# Patient Record
Sex: Female | Born: 1960 | Race: White | Hispanic: No | Marital: Married | Smoking: Never smoker
Health system: Southern US, Community
[De-identification: ages and names within clinical notes are randomized; demographics above are authoritative.]

## PROBLEM LIST (undated history)

## (undated) ENCOUNTER — Emergency Department (HOSPITAL_COMMUNITY): Payer: Self-pay | Source: Home / Self Care

## (undated) DIAGNOSIS — F32A Depression, unspecified: Secondary | ICD-10-CM

## (undated) DIAGNOSIS — I10 Essential (primary) hypertension: Secondary | ICD-10-CM

---

## 1999-04-17 ENCOUNTER — Encounter (INDEPENDENT_AMBULATORY_CARE_PROVIDER_SITE_OTHER): Payer: Self-pay

## 1999-04-17 ENCOUNTER — Ambulatory Visit (HOSPITAL_COMMUNITY): Admission: RE | Admit: 1999-04-17 | Discharge: 1999-04-17 | Payer: Self-pay | Admitting: Obstetrics and Gynecology

## 1999-06-17 ENCOUNTER — Other Ambulatory Visit: Admission: RE | Admit: 1999-06-17 | Discharge: 1999-06-17 | Payer: Self-pay | Admitting: Obstetrics and Gynecology

## 1999-06-18 ENCOUNTER — Other Ambulatory Visit: Admission: RE | Admit: 1999-06-18 | Discharge: 1999-06-18 | Payer: Self-pay | Admitting: Obstetrics and Gynecology

## 1999-06-18 ENCOUNTER — Encounter (INDEPENDENT_AMBULATORY_CARE_PROVIDER_SITE_OTHER): Payer: Self-pay | Admitting: Specialist

## 2000-02-03 ENCOUNTER — Other Ambulatory Visit: Admission: RE | Admit: 2000-02-03 | Discharge: 2000-02-03 | Payer: Self-pay | Admitting: Obstetrics and Gynecology

## 2000-02-04 ENCOUNTER — Other Ambulatory Visit: Admission: RE | Admit: 2000-02-04 | Discharge: 2000-02-04 | Payer: Self-pay | Admitting: Obstetrics and Gynecology

## 2000-02-04 ENCOUNTER — Encounter (INDEPENDENT_AMBULATORY_CARE_PROVIDER_SITE_OTHER): Payer: Self-pay | Admitting: Specialist

## 2000-08-03 ENCOUNTER — Other Ambulatory Visit: Admission: RE | Admit: 2000-08-03 | Discharge: 2000-08-03 | Payer: Self-pay | Admitting: Obstetrics and Gynecology

## 2001-07-14 ENCOUNTER — Encounter (INDEPENDENT_AMBULATORY_CARE_PROVIDER_SITE_OTHER): Payer: Self-pay

## 2001-07-15 ENCOUNTER — Inpatient Hospital Stay (HOSPITAL_COMMUNITY): Admission: RE | Admit: 2001-07-15 | Discharge: 2001-07-16 | Payer: Self-pay | Admitting: Obstetrics and Gynecology

## 2002-02-25 ENCOUNTER — Other Ambulatory Visit: Admission: RE | Admit: 2002-02-25 | Discharge: 2002-02-25 | Payer: Self-pay | Admitting: Obstetrics and Gynecology

## 2003-06-05 ENCOUNTER — Other Ambulatory Visit: Admission: RE | Admit: 2003-06-05 | Discharge: 2003-06-05 | Payer: Self-pay | Admitting: Obstetrics and Gynecology

## 2004-07-16 ENCOUNTER — Other Ambulatory Visit: Admission: RE | Admit: 2004-07-16 | Discharge: 2004-07-16 | Payer: Self-pay | Admitting: Obstetrics and Gynecology

## 2005-07-24 ENCOUNTER — Other Ambulatory Visit: Admission: RE | Admit: 2005-07-24 | Discharge: 2005-07-24 | Payer: Self-pay | Admitting: Obstetrics and Gynecology

## 2007-02-26 ENCOUNTER — Ambulatory Visit (HOSPITAL_COMMUNITY): Admission: RE | Admit: 2007-02-26 | Discharge: 2007-02-26 | Payer: Self-pay | Admitting: Obstetrics and Gynecology

## 2007-02-26 ENCOUNTER — Encounter (INDEPENDENT_AMBULATORY_CARE_PROVIDER_SITE_OTHER): Payer: Self-pay | Admitting: Obstetrics and Gynecology

## 2010-10-01 NOTE — H&P (Signed)
[name], COCO NO.:  [id number]   MEDICAL RECORD NO.:  [id number]          PATIENT TYPE:  AMB   LOCATION:  SDC                           FACILITY:  [facility]   PHYSICIAN:  Juluis Mire, M.D.   DATE OF BIRTH:  January 07, 1961   DATE OF ADMISSION:  02/26/2007  DATE OF DISCHARGE:                              HISTORY & PHYSICAL   The patient is a 50 year old gravida 3, para 3 married female, presents  for laparoscopic evaluation with removal of a left ovarian cyst versus  left ovarian cystectomy.   In relation to the present admission, the patient has had a previous  laparoscopic-assisted vaginal hysterectomy, for management of uterine  fibroids.  She has had some persistent left lower quadrant pain.  Ultrasound evaluation continue to reveal a 5 to 6-cm left ovarian cyst.  This persisted despite evaluation.  Her CA-125 was normal.  In view of  the persistence and the pain, we are going to proceed with laparoscopic  evaluation, possible left salpingo-oophorectomy.   ALLERGIES:  IN TERMS OF ALLERGIES, THE PATIENT HAS NO KNOWN DRUG  ALLERGIES.   MEDICATIONS:  She takes lisinopril for hypertension, Premarin and  Nexium.   PAST MEDICAL HISTORY:  Usual childhood disease without any significant  sequelae.   PAST SURGICAL HISTORY:  She had previous diagnostic laparoscopy and  hysteroscopy in 2000.  Subsequently in 2003, she had a laparoscopic-  assisted vaginal hysterectomy.  She has had a laparoscopic bilateral  tubal ligation.   OBSTETRICAL HISTORY:  Has three vaginal deliveries.   FAMILY HISTORY:  Mother with a history of hypertension, as well as lung  cancer.   SOCIAL HISTORY:  No tobacco, alcohol use.   REVIEW OF SYSTEMS:  Noncontributory.   PHYSICAL EXAM:  VITAL SIGNS:  The patient is afebrile, stable vital  signs.  HEENT: The patient is normocephalic.  Pupils equal, round, reactive to  light and accommodation.  Extraocular movements were intact.   Sclerae  and conjunctivae are clear.  Oropharynx clear.  NECK:  Without thyromegaly.  BREASTS:  No discrete masses.  LUNGS:  Clear.  CARDIOVASCULAR:  System regular in rate, no murmurs or gallops.  No  carotid bruits.  ABDOMINAL:  Exam is benign.  No mass, organomegaly or tenderness.  PELVIC:  Normal external genitalia.  Vaginal mucosa is clear.  Cuff  intact.  She has good support.  BIMANUAL:  Unable to feel the mass on the left side.  Therefore, bi-  manual exam is unremarkable.  EXTREMITIES:  Trace edema.  NEUROLOGIC:  Grossly within normal limits.   IMPRESSION:  Persistent cystic enlargement, left ovary with associated  pain.   PLAN:  The patient to undergo laparoscopic evaluation, possible removal  of cyst versus ovary.  The risks of surgery have been discussed,  including the risk of infection.  The risk of hemorrhage that could  require transfusion with the risk of AIDS or hepatitis.  Risk of injury  to adjacent organs including bladder, bowel, ureters that could require  further exploratory surgery.  Risk of deep venous thrombosis and  pulmonary  emboli.  The patient expressed understanding, indications and  risks.      Juluis Mire, M.D.  Electronically Signed     JSM/MEDQ  D:  02/26/2007  T:  02/26/2007  Job:  [id number]

## 2010-10-01 NOTE — Op Note (Signed)
[name], [name]                 ACCOUNT NO.:  [id number]   MEDICAL RECORD NO.:  [id number]          PATIENT TYPE:  AMB   LOCATION:  SDC                           FACILITY:  [facility]   PHYSICIAN:  Juluis Mire, M.D.   DATE OF BIRTH:  12/28/1960   DATE OF PROCEDURE:  02/26/2007  DATE OF DISCHARGE:                               OPERATIVE REPORT   PREOPERATIVE DIAGNOSIS:  Persistent cystic enlargement of the left ovary  with associated pelvic pain.   POSTOPERATIVE DIAGNOSIS:  Persistent cystic enlargement of the left  ovary with associated pelvic pain.   OPERATIVE PROCEDURE:  Open laparoscopy.  Lysis of adhesions.  Pelvic  washings.  Peritoneal biopsies.  Left salpingo-oophorectomy.   SURGEON:  Juluis Mire, M.D.   ANESTHESIA:  Was general.   ESTIMATED BLOOD LOSS:  Minimal.   PACKS AND DRAINS:  None.   INTRAOPERATIVE BLOOD REPLACED:  None.   COMPLICATIONS:  None.   INDICATIONS:  Are dictated in the history and physical.   PROCEDURE AS FOLLOWS:  The patient was taken to OR and placed in supine  position.  After satisfactory level of general anesthesia was obtained,  the patient was placed in the dorsal supine position using the Allen  stirrups.  The abdomen, perineum and vagina were prepped out with  Betadine.  Bladder was emptied by in-and-out catheterization.  A  subumbilical incision was made with a knife and extended to the  subcutaneous tissue.  Fascia was entered sharply and incision in fascia  extended laterally.  Muscles were separated, and the peritoneum was  entered with blunt finger pressure.  The taut open laparoscopic trocar  was put in place and secured.  The laparoscope was introduced.  There  was no evidence of injury to adjacent organs.  A 5-mm trocar was put in  place in the suprapubic area under direct visualization.  On  visualization, she had some implant areas on the bladder flap as well as  the right perineal area.  There was no ascites.  The omentum  was normal.  The upper abdomen including liver was clear as well as the diaphragm.  We elevated the left ovary.  There was some adhesions from the sigmoid  colon to the left pelvic sidewall.  The right ovary was unremarkable.  Because of these areas of implants which could be scarring or burned out  endometriosis, we did pelvic washings.  We also did biopsies from the  areas of the peritoneum and sent for pathological review.  At this point  in time, a 5-mm trocar was put in place in the left lower quadrant.  We  were able to dissect the adhesions from the sigmoid colon to the left  pelvic side wall.  This mainly involved the __________ .  At this point  in time, the left ovary was elevated.  We were able to make an incision  in  the peritoneum over the left psoas muscle.  We able to dissect down  along the pelvic sidewall.  The ovary was freed up.  The vasculature was  isolated.  We felt the ureter was well below this.  The vasculature was  cauterized and incised using the gyrus.  We then put a 10/11 trocar in  suprapubic area.  We put the ovary in an Endobag, brought it up through  the suprapubic incision.  It was sent for pathological review.  It  appeared to be fairly normal.  At this point in time, we revisualized  the pelvic cavity.  We thoroughly irrigated.  We had good hemostasis in  all areas.  We tried to identify the ureter along his left pelvic  sidewall and had difficulty through the laparoscope.  Decision was to do  cystoscopy.  At this point in time, all trocars were removed after the  abdomen was inflated with carbon dioxide.  Subumbilical fascia was  closed with figure-of-eight of 0 Vicryl, skin with interrupted  subcuticulars of 4-0 Vicryl.  The suprapubic incision was closed with  interrupted subcuticulars of 4-0 Vicryl and Dermabond, and the left  suprapubic incision was closed with Dermabond.   At this point since cystoscopy was performed, the patient was given   indigo carmine.  She had free spillage of blue dye from both ureteral  orifices.  So at this point in time, the bladder was emptied.  Cystoscope was removed.  The patient was taken out of dorsal lithotomy  position.  Once alert and extubated, transferred to the recovery room in  good.  Sponge, instrument and needle count was reported as correct by  circulating nurse x2.      Juluis Mire, M.D.  Electronically Signed     JSM/MEDQ  D:  02/26/2007  T:  02/26/2007  Job:  [id number]

## 2010-10-04 NOTE — Op Note (Signed)
[facility]  Patient:    [name], [name] Visit Number: [id number] MRN: [id number]          Service Type: DSU Location: 9300 [id number] Attending Physician:  Frederich Balding Dictated by:   Juluis Mire, M.D. Proc. Date: 07/14/01 Admit Date:  07/14/2001                             Operative Report  PREOPERATIVE DIAGNOSES:       1. Uterine fibroids.                               2. Uterine adenomyosis.  POSTOPERATIVE DIAGNOSES:      1. Uterine fibroids.                               2. Uterine adenomyosis.  PROCEDURE:                    Laparoscopic assisted vaginal hysterectomy.  SURGEON:                      Juluis Mire, M.D.  ASSISTANT:                    Trevor Iha, M.D.  ANESTHESIA:                   General endotracheal.  ESTIMATED BLOOD LOSS:         400 cc.  PACKS AND DRAINS:             None.  INTRAOPERATIVE BLOOD REPLACEMENT:                  None.  COMPLICATIONS:                None.  INDICATIONS:                  Noted in the history and physical.  DESCRIPTION OF PROCEDURE:     The patient was taken to the OR and placed in the supine position. After a satisfactory level of general endotracheal anesthesia was obtained, the patient was placed in the dorsal lithotomy position using Allen stirrups. The abdomen, perineum, and vagina were prepped out with Betadine. The bladder was emptied by in-and-out catheterization. A Hulka tenaculum was put in place and secured. The patient was draped out for surgery. A subumbilical incision was made with the knife and extended through subcutaneous tissue. The fascia was then identified and entered sharply and the incision in the fascia extended laterally. Rectus muscles were separated in the midline. The peritoneum was entered sharply. Two lateral sutures of 0 Vicryl were put in place in the fascia and held. The Hasson cannula was put in place and secured with the held sutures.  Abdomen was inflated with carbon dioxide. The laparoscope was introduced. There was no evidence of injury to adjacent organs. A 5 mm trocar was put in the suprapubic area under direct visualization. Appendix was normal. Upper abdomen, including liver tip, the gallbladder and both lateral gutters, were clear. Visualization of the pelvis revealed overall uterine enlargement; tubes and ovaries were unremarkable. Previous bilateral tubal ligation was noted. Using the plasma kinetic cautery incising unit, we went about freeing up the adnexa. The right utero-ovarian ligament was  cauterized and incised. The right tube and mesosalpinx was cauterized and incised and the right round ligament was cauterized and incised. With this we had complete freeing up of the right adnexa. On the left side, we first cauterized and incised the utero-ovarian pedicle, then the rube and mesosalpinx were cauterized and incised, and the left round ligament was cauterized and incised. Both sides were adequately freed up and well away from ureters that were easily visualized in the pelvis. At this point in time, we decided to proceed vaginally. The abdomen was deflated of its carbon dioxide. The patients legs were repositioned.  At this point in time, the Hulka tenaculum was then removed. A weighted speculum was placed in the vaginal vault. The cervix was grasped with the [facility] tenaculum. Cul-de-sac was entered sharply. Both uterosacral ligaments were clamped, cut, and suture ligated with 0 Vicryl. The reflection of the vaginal mucosa was incised using the scissors. The bladder was dissected superiorly. Paracervical tissue was clamped, cut, and suture ligated with 0 Vicryl. The uterine vessels were clamped, cut, and suture ligated with 0 Vicryl. The vesicouterine space was entered sharply and retractors put in place to retract the bladder superiorly. Using clamp, cut, and tie technique with suture ligatures of 0 Vicryl,  the parametrium was serially separated from the sides of the uterus. The uterus was then flipped, the remaining pedicles were clamped and cut, and the uterus was passed off the operative field. Held pedicles were secured with two free ties of 0 Vicryl. Uterosacral plication stitch was put in place with 0 Vicryl. Vaginal mucosa was closed in a vertical fashion with interrupted figure-of-eights of 0 Vicryl. At this point in time, we had good hemostasis, a Foley was placed to straight drain with retrieval of an adequate amount of clear urine. Sponge on spongestick was placed on the vaginal vault.  The patients legs were repositioned. The abdomen was reinflated with carbon dioxide and the laparoscope was reintroduced. We thoroughly irrigated the pelvic cavity and no bleeding was noted. At this point in time, the abdomen was deflated of its carbon dioxide, all trocars were removed, subumbilical fascia was closed with a figure-of-eight of 0 Vicryl. Skin was closed with running subcuticular 4-0 Vicryl. Suprapubic incision was closed with Steri-Strips. Sponge on spongestick was removed from the vaginal vault. Urine output remained clear and adequate. Sponge, instrument, and needle count were reported as correct by the circulating nurse x 2. The patient, once extubated, was transferred to recovery room in good condition. Dictated by:   Juluis Mire, M.D. Attending Physician:  Frederich Balding DD:  07/14/01 TD:  07/14/01 Job: [id number]

## 2010-10-04 NOTE — Discharge Summary (Signed)
[facility]  Patient:    MERIS, REEDE Visit Number: [id number] MRN: [id number]          Service Type: GYN Location: 9300 [id number] Attending Physician:  Frederich Balding Dictated by:   Juluis Mire, M.D. Admit Date:  07/14/2001 Discharge Date: 07/16/2001                             Discharge Summary  ADDENDUM  The patient had some increasing nausea and vomiting the evening of discharge. Dr. Marcelle Overlie therefore maintained her overnight.  The following morning, she was again afebrile with stable vital signs.  Her abdomen was soft.  She was passing flatus at this time, and tolerating her diet without difficulty.  She is therefore discharged home with the previous instructions, orders, and conditions. Dictated by:   Juluis Mire, M.D. Attending Physician:  Frederich Balding DD:  07/16/01 TD:  07/16/01 Job: [id number]

## 2010-10-04 NOTE — H&P (Signed)
[facility]  Patient:    [name], [name] Visit Number: [id number] MRN: [id number]          Service Type: DSU Location: 910B [id number] 01 Attending Physician:  Frederich Balding Dictated by:   Juluis Mire, M.D. Admit Date:  07/14/2001                           History and Physical  CHIEF COMPLAINT:              The patient is a 50 year old gravida 3 para 3 married white female, who presents for laparoscopically assisted vaginal hysterectomy for management of uterine fibroids and possible uterine adenomyosis and associated symptomatology.  HISTORY OF PRESENT ILLNESS:   In relation to the present admission, the patient has had a long-standing history of increasing menstrual flow and dysmenorrhea.  Recently she has noted that her cycles will last approximately seven days.  She has six days of flow being heavy, changing pads and tampons every 30 minutes to one hour.  Associated with this heavy flow is clots.  She also has significant pain and discomfort that is unresponsive to over-the-counter management.  She has had anemia, with a hemoglobin of 10.7. This has required iron sulfate supplementation.  She did have a previous laparoscopy and hysteroscopy done in 2000, with finding of uterine fibroids and uterine adenomyosis.  There were no adhesions or other pelvic processes. We have watched her conservatively since the surgery and she has continued to complain of significant menstrual flow, and does have associated anemia.  She has had previous bilateral tubal ligation.  Has had complications using birth control pills in the past, and desires further therapy in the form of laparoscopically assisted vaginal hysterectomy.  PAST GYNECOLOGIC HISTORY:     Her only past significant gynecological history is that she had a previous history of atypical endocervical cells on her Pap smear.  She underwent colposcopy as well as repeat cervical cytology with  no evidence of any significant issues.  It is of note she has had a previous bilateral tubal ligation.  ALLERGIES:                    No known drug allergies.  MEDICATIONS:                  Iron.  PAST MEDICAL HISTORY:         Usual childhood diseases, no significant sequelae.  PAST SURGICAL HISTORY:        1. Previous bilateral tubal ligation.                               2. Surgery as noted above.  PAST OBSTETRICAL HISTORY:     Three spontaneous vaginal deliveries.  FAMILY HISTORY:               Mother with history of hypertension.  Mother also with history of lung cancer.  SOCIAL HISTORY:               No tobacco or alcohol use.  REVIEW OF SYSTEMS:            Noncontributory.  PHYSICAL EXAMINATION:  VITAL SIGNS:                  The patient is afebrile with stable vital signs.  HEENT:  Normocephalic.  PERRLA.  EOMI.  Sclerae and conjunctivae clear.  Oropharynx clear.  NECK:                         Without thyromegaly.  BREAST:                       No discrete masses.  LUNGS:                        Clear.  CARDIOVASCULAR:               Regular rate and rhythm without murmurs or gallops.  ABDOMEN:                      Benign.  No masses, organomegaly, or tenderness.  PELVIC:                       Normal external genitalia.  Vaginal mucosa clear.  Cervix unremarkable.  Uterus enlarged with multiple fibroids, approximately eight weeks.  Adnexa unremarkable.  EXTREMITIES:                  Trace edema.  NEUROLOGIC:                   Grossly within normal limits.  IMPRESSION:                   Menorrhagia and dysmenorrhea secondary to uterine fibroids or uterine adenomyosis.  PLAN:                         The patient will undergo laparoscopically assisted vaginal hysterectomy.  Alternatives to surgery have been discussed including hormonal management and possible endometrial ablation.  The patient desires definitive therapy.  The risks of  surgery have been discussed including the risk of infection, the risk of hemorrhage that could necessitate transfusion with the risk of AIDS or hepatitis, the risk of injury to adjacent organs including bladder, bowel, or ureters that could require further exploratory surgery, risk of deep vein thrombosis and pulmonary embolus.  The patient expressed understanding of indications and risks. Dictated by:   Juluis Mire, M.D. Attending Physician:  Frederich Balding DD:  07/14/01 TD:  07/14/01 Job: [id number]

## 2010-10-04 NOTE — Discharge Summary (Signed)
[facility]  Patient:    [name], [name] Visit Number: [id number] MRN: [id number]          Service Type: DSU Location: 9300 [id number] Attending Physician:  Frederich Balding Dictated by:   Juluis Mire, M.D. Admit Date:  07/14/2001 Disc. Date: 07/15/01                             Discharge Summary  ADMISSION DIAGNOSES:          1. Uterine fibroids.                               2. Possible uterine adenomyosis.  DISCHARGE DIAGNOSES:          1. Uterine fibroids.                               2. Possible uterine adenomyosis with pathology                                  still pending.  OPERATIVE PROCEDURE:          Laparoscopic assisted vaginal hysterectomy.  For complete history and physical please see dictated note.  HOSPITAL COURSE:              The patient underwent laparoscopic assisted vaginal hysterectomy.  Ovaries were left in place.  Postoperatively, the patient did well.  Postoperative hemoglobin was 7.9.  It had preoperatively been 9.4 so we felt that was stable.  She was discharged home at her request on her first postoperative day.  At that time, she was afebrile with stable vital signs.  Her lungs were clear.  Abdomen was soft and nontender. Bowel sounds were active. All incisions were intact.  She had minimal vaginal draining.  Urine output was adequate.  COMPLICATIONS:                None during stay in the hospital.  CONDITION ON DISCHARGE:       Stable.  DISPOSITION:                  The patient will be discharged home on Tylox as needed for pain and iron sulfate supplementation.  We will reassess her hemoglobin back in the office next week.  She is to avoid heavy lifting, vaginal entry, or driving a car.  She will watch for signs of infection, nausea, vomiting, increased abdominal pain or active vaginal bleeding. Dictated by:   Juluis Mire, M.D. Attending Physician:  Frederich Balding DD:  07/15/01 TD:   07/15/01 Job: [id number]

## 2011-02-27 LAB — CBC
HCT: 40.3
Hemoglobin: 14
MCHC: 34.7
MCV: 84
RDW: 12.7

## 2014-04-17 ENCOUNTER — Encounter: Payer: Self-pay | Admitting: Podiatry

## 2014-04-17 ENCOUNTER — Ambulatory Visit (INDEPENDENT_AMBULATORY_CARE_PROVIDER_SITE_OTHER): Payer: BC Managed Care – PPO | Admitting: Podiatry

## 2014-04-17 ENCOUNTER — Ambulatory Visit (INDEPENDENT_AMBULATORY_CARE_PROVIDER_SITE_OTHER): Payer: BC Managed Care – PPO

## 2014-04-17 VITALS — BP 134/76 | HR 64 | Resp 16

## 2014-04-17 DIAGNOSIS — M722 Plantar fascial fibromatosis: Secondary | ICD-10-CM

## 2014-04-17 MED ORDER — TRIAMCINOLONE ACETONIDE 10 MG/ML IJ SUSP
10.0000 mg | Freq: Once | INTRAMUSCULAR | Status: AC
  Administered 2014-04-17: 10 mg

## 2014-04-17 MED ORDER — DICLOFENAC SODIUM 75 MG PO TBEC: 75.0000 mg | DELAYED_RELEASE_TABLET | Freq: Two times a day (BID) | ORAL | Status: DC

## 2014-04-17 NOTE — Progress Notes (Signed)
Subjective:     Patient ID: [name], female   DOB: 01/24/1961, 53 y.o.   MRN: [id number]  HPI patient states that she's been having a lot of pain in her heels over the last few months and it's worse when she stands and walks on them does not remember any specific injury   Review of Systems  All other systems reviewed and are negative.      Objective:   Physical Exam  Constitutional: She is oriented to person, place, and time.  Cardiovascular: Intact distal pulses.   Musculoskeletal: Normal range of motion.  Neurological: She is oriented to person, place, and time.  Skin: Skin is warm.  Nursing note and vitals reviewed.  neurovascular status intact with muscle strength adequate and range of motion subtalar and midtarsal joint within normal limits. Patient has mild equinus is noted to have good digital perfusion and is well oriented 3 and I noted there to be exquisite tenderness in the plantar center of both heels right over left     Assessment:     Plantar fasciitis right over left heel insertional in origin    Plan:     H&P and condition discussed. Injected the plantar fascia bilateral 3 mg Kenalog 5 mg Xylocaine and applied fascially brace to the right. Placed on diclofenac 75 mg twice a day and reappoint in 1 week to recheck

## 2014-04-17 NOTE — Progress Notes (Signed)
   Subjective:    Patient ID: [name], female    DOB: 04/03/1961, 53 y.o.   MRN: [id number]  HPI Comments: "I have pain in the heel"  Patient c/o aching plantar/medial heel right for couple months. No AM pain. Taking Ibuprofen. Tried new shoes. Not helping. Some of the same pain on left but doesn't hurt everyday.  Foot Pain Associated symptoms include arthralgias.      Review of Systems  Musculoskeletal: Positive for back pain, arthralgias and gait problem.  All other systems reviewed and are negative.      Objective:   Physical Exam        Assessment & Plan:

## 2014-04-17 NOTE — Patient Instructions (Signed)

## 2014-04-24 ENCOUNTER — Ambulatory Visit: Payer: BC Managed Care – PPO | Admitting: Podiatry

## 2014-05-09 ENCOUNTER — Other Ambulatory Visit: Payer: Self-pay

## 2014-05-10 LAB — CYTOLOGY - PAP

## 2014-08-03 ENCOUNTER — Encounter: Payer: Self-pay | Admitting: Podiatry

## 2014-08-03 ENCOUNTER — Ambulatory Visit (INDEPENDENT_AMBULATORY_CARE_PROVIDER_SITE_OTHER): Payer: BLUE CROSS/BLUE SHIELD | Admitting: Podiatry

## 2014-08-03 VITALS — BP 112/64 | HR 58 | Resp 14

## 2014-08-03 DIAGNOSIS — M722 Plantar fascial fibromatosis: Secondary | ICD-10-CM | POA: Diagnosis not present

## 2014-08-03 MED ORDER — DICLOFENAC SODIUM 75 MG PO TBEC: 75.0000 mg | DELAYED_RELEASE_TABLET | Freq: Two times a day (BID) | ORAL | Status: DC

## 2014-08-03 MED ORDER — TRIAMCINOLONE ACETONIDE 10 MG/ML IJ SUSP
10.0000 mg | Freq: Once | INTRAMUSCULAR | Status: AC
  Administered 2014-08-03: 10 mg

## 2014-08-03 NOTE — Progress Notes (Signed)
   Subjective:    Patient ID: [name], female    DOB: 01/01/1961, 54 y.o.   MRN: [id number]  HPI F/U had inj in right foot on 04-17-14, had good result up until about a couple of weeks ago. Left foot also started hurting recently.    Review of Systems  All other systems reviewed and are negative.      Objective:   Physical Exam        Assessment & Plan:

## 2014-08-03 NOTE — Progress Notes (Signed)
Subjective:     Patient ID: [name], female   DOB: 01/01/1961, 54 y.o.   MRN: [id number]  HPI patient states that my heels have been hurting me again and it's walking on the cement floors that seems to cause the problem and I often work 50-60 hours a week   Review of Systems     Objective:   Physical Exam Neurovascular status intact with muscle strength adequate and severe discomfort medial fascial band right over left at the insertional point of the tendon into the calcaneus. Patient is found to have moderate depression of the arch    Assessment:     Plantar fasciitis right over left heel with inflammation and fluid buildup    Plan:     I injected the plantar fascia of both feet 3 mg Kenalog 5 mg Xylocaine and dispensed a fascially brace for the left and scanned for custom orthotics to reduce stress. I explained the importance of stretching exercises and supportive shoe gear and I prescribed diclofenac 75 mg twice a day

## 2014-08-25 ENCOUNTER — Ambulatory Visit: Payer: BLUE CROSS/BLUE SHIELD | Admitting: *Deleted

## 2014-08-25 ENCOUNTER — Telehealth: Payer: Self-pay | Admitting: Podiatry

## 2014-08-25 DIAGNOSIS — M722 Plantar fascial fibromatosis: Secondary | ICD-10-CM

## 2014-08-25 NOTE — Patient Instructions (Signed)

## 2014-08-25 NOTE — Telephone Encounter (Signed)
PT CALLED ASKING IF WE HAD CONTACTED HER INSURANCE AND IF SHE WOULD HAVE TO PAY FOR HER ORTHOTICS.

## 2014-08-25 NOTE — Progress Notes (Signed)
Patient ID: [name], female   DOB: 01/20/1961, 54 y.o.   MRN: [id number] PICKING UP INSERTS

## 2014-08-25 NOTE — Telephone Encounter (Signed)
Ms Chip BoerVicki could you please assist me there is no FYI on orthotic coverage and as of today no response from the insurance company for the charges.

## 2014-11-13 ENCOUNTER — Encounter: Payer: Self-pay | Admitting: Podiatry

## 2014-11-13 ENCOUNTER — Ambulatory Visit (INDEPENDENT_AMBULATORY_CARE_PROVIDER_SITE_OTHER): Payer: BLUE CROSS/BLUE SHIELD | Admitting: Podiatry

## 2014-11-13 VITALS — BP 140/86 | HR 61 | Resp 15

## 2014-11-13 DIAGNOSIS — M722 Plantar fascial fibromatosis: Secondary | ICD-10-CM

## 2014-11-13 NOTE — Patient Instructions (Signed)
Pre-Operative Instructions  Congratulations, you have decided to take an important step to improving your quality of life.  You can be assured that the doctors of [facility] will be with you every step of the way.  1. Plan to be at the surgery center/hospital at least 1 (one) hour prior to your scheduled time unless otherwise directed by the surgical center/hospital staff.  You must have a responsible adult accompany you, remain during the surgery and drive you home.  Make sure you have directions to the surgical center/hospital and know how to get there on time. 2. For hospital based surgery you will need to obtain a history and physical form from your family physician within 1 month prior to the date of surgery- we will give you a form for you primary physician.  3. We make every effort to accommodate the date you request for surgery.  There are however, times where surgery dates or times have to be moved.  We will contact you as soon as possible if a change in schedule is required.   4. No Aspirin/Ibuprofen for one week before surgery.  If you are on aspirin, any non-steroidal anti-inflammatory medications (Mobic, Aleve, Ibuprofen) you should stop taking it 7 days prior to your surgery.  You make take Tylenol  For pain prior to surgery.  5. Medications- If you are taking daily heart and blood pressure medications, seizure, reflux, allergy, asthma, anxiety, pain or diabetes medications, make sure the surgery center/hospital is aware before the day of surgery so they may notify you which medications to take or avoid the day of surgery. 6. No food or drink after midnight the night before surgery unless directed otherwise by surgical center/hospital staff. 7. No alcoholic beverages 24 hours prior to surgery.  No smoking 24 hours prior to or 24 hours after surgery. 8. Wear loose pants or shorts- loose enough to fit over bandages, boots, and casts. 9. No slip on shoes, sneakers are best. 10. Bring  your boot with you to the surgery center/hospital.  Also bring crutches or a walker if your physician has prescribed it for you.  If you do not have this equipment, it will be provided for you after surgery. 11. If you have not been contracted by the surgery center/hospital by the day before your surgery, call to confirm the date and time of your surgery. 12. Leave-time from work may vary depending on the type of surgery you have.  Appropriate arrangements should be made prior to surgery with your employer. 13. Prescriptions will be provided immediately following surgery by your doctor.  Have these filled as soon as possible after surgery and take the medication as directed. 14. Remove nail polish on the operative foot. 15. Wash the night before surgery.  The night before surgery wash the foot and leg well with the antibacterial soap provided and water paying special attention to beneath the toenails and in between the toes.  Rinse thoroughly with water and dry well with a towel.  Perform this wash unless told not to do so by your physician.  Enclosed: 1 Ice pack (please put in freezer the night before surgery)   1 Hibiclens skin cleaner   Pre-op Instructions  If you have any questions regarding the instructions, do not hesitate to call our office.  [place]: [address] [place], [place] [address] [phone]  [place]: [address] [place], [place] [address] [phone]  [place]: [address]  [place], [place] [address] [phone]  Dr. Richard   Tuchman DPM, Dr. Norman Regal DPM Dr. Richard Sikora DPM, Dr. M. Todd Hyatt DPM, Dr. Kathryn Egerton DPM 

## 2014-11-15 NOTE — Progress Notes (Signed)
Subjective:     Patient ID: [name], female   DOB: 09/25/1960, 54 y.o.   MRN: [id number]  HPI patient states I developed shooting pain in the plantar aspect of my right heel that's more recent duration. States it's been very hard to walk comfortably with   Review of Systems     Objective:   Physical Exam Neurovascular status intact muscle strength adequate with discomfort plantar aspect right heel at the insertional point tendon the calcaneus with inflammation fluid noted and swelling around the insertion into the calcaneus    Assessment:     Plantar fasciitis right with inflammation and fluid buildup noted    Plan:     H&P x-rays reviewed and today injected the plantar fascia 3 Milligan Kenalog 5 mill grams Xylocaine applied fascial brace and instructed on physical therapy and supportive shoe gear usage. Placed on diclofenac 75 mg twice a day and reappoint to recheck in one week

## 2014-11-17 ENCOUNTER — Telehealth: Payer: Self-pay | Admitting: *Deleted

## 2014-11-17 NOTE — Telephone Encounter (Signed)
"  I have surgery scheduled on 08/02 with Dr. Charlsie Merlesegal.  I need to know my portion for the bill.  I know I have a deductible so please let me know that, I'd greatly appreciate it."

## 2014-11-21 ENCOUNTER — Telehealth: Payer: Self-pay | Admitting: *Deleted

## 2014-11-21 NOTE — Telephone Encounter (Addendum)
Pt states she is scheduled or surgery 12/19/2014, but is in severe pain and would like a shot.  Dr. Charlsie Merlesegal would like pt to come in to discuss pain management option.  Left a message for pt to schedule an appt.

## 2014-11-21 NOTE — Telephone Encounter (Signed)
Bring her in

## 2014-11-22 ENCOUNTER — Ambulatory Visit (INDEPENDENT_AMBULATORY_CARE_PROVIDER_SITE_OTHER): Payer: BLUE CROSS/BLUE SHIELD | Admitting: Podiatry

## 2014-11-22 ENCOUNTER — Encounter: Payer: Self-pay | Admitting: Podiatry

## 2014-11-22 DIAGNOSIS — M722 Plantar fascial fibromatosis: Secondary | ICD-10-CM | POA: Diagnosis not present

## 2014-11-22 MED ORDER — TRIAMCINOLONE ACETONIDE 10 MG/ML IJ SUSP
10.0000 mg | Freq: Once | INTRAMUSCULAR | Status: AC
  Administered 2014-11-22: 10 mg

## 2014-11-23 NOTE — Telephone Encounter (Signed)
I called and informed patient that she has a $1,500 deductible.  Once the deductible is met your insurance will cover 80% and you'll be responsible for 20%.  "I have to pay that before I can have surgery?"  No, we will file it with your insurance.  Then will send you a statement and you can make payment arrangements.  "Okay, good I was worried."  If you need the cost for facility and anesthesia, call the surgical center.

## 2014-11-24 NOTE — Progress Notes (Signed)
Subjective:     Patient ID: [name], female   DOB: 07/02/1960, 54 y.o.   MRN: [id number]  HPI patient states I'm do for surgery and around a month and the pain is so bad and I'm going on vacation and I would like to get medicine to try to help me through this.   Review of Systems     Objective:   Physical Exam Neurovascular status is intact with severe discomfort in the medial fascial band of both heels with inflammation and fluid at the insertion of the tendon into the calcaneus    Assessment:     Plantar fasciitis bilateral with inflammation and fluid buildup    Plan:     Reviewed condition and injected the plantar fascia 3 mg Kenalog 5 mg Xylocaine and advised on physical therapy and I inflammatory and supportive shoe gear usage until surgery can be obtained

## 2014-12-19 ENCOUNTER — Encounter: Payer: Self-pay | Admitting: Podiatry

## 2014-12-19 DIAGNOSIS — M722 Plantar fascial fibromatosis: Secondary | ICD-10-CM | POA: Diagnosis not present

## 2014-12-26 NOTE — Progress Notes (Signed)
DOS 12/19/2014 Endoscopic release medial band right heel.

## 2014-12-28 ENCOUNTER — Encounter: Payer: Self-pay | Admitting: Podiatry

## 2014-12-28 ENCOUNTER — Ambulatory Visit (INDEPENDENT_AMBULATORY_CARE_PROVIDER_SITE_OTHER): Payer: BLUE CROSS/BLUE SHIELD | Admitting: Podiatry

## 2014-12-28 VITALS — BP 139/93 | HR 60 | Resp 16

## 2014-12-28 DIAGNOSIS — M722 Plantar fascial fibromatosis: Secondary | ICD-10-CM

## 2014-12-28 NOTE — Progress Notes (Signed)
Subjective:     Patient ID: [name], female   DOB: Nov 30, 1960, 54 y.o.   MRN: [id number]  HPI patient states I'm doing well with my heel   Review of Systems     Objective:   Physical Exam Neurovascular status intact with incision sites and wound edges well coapted on the medial and lateral side of the right heel with negative Homans sign noted    Assessment:     Doing well post endoscopic procedure right heel    Plan:     Sterile dressing reapplied advised on elevation and continued boot usage and dispensed surgical shoe. Patient will be seen back 2 weeks for suture removal or earlier if necessary

## 2015-01-09 ENCOUNTER — Ambulatory Visit: Payer: BLUE CROSS/BLUE SHIELD | Admitting: Podiatry

## 2015-01-09 DIAGNOSIS — M722 Plantar fascial fibromatosis: Secondary | ICD-10-CM

## 2015-04-19 ENCOUNTER — Encounter: Payer: Self-pay | Admitting: Podiatry

## 2015-04-19 ENCOUNTER — Ambulatory Visit (INDEPENDENT_AMBULATORY_CARE_PROVIDER_SITE_OTHER): Payer: BLUE CROSS/BLUE SHIELD

## 2015-04-19 ENCOUNTER — Ambulatory Visit (INDEPENDENT_AMBULATORY_CARE_PROVIDER_SITE_OTHER): Payer: BLUE CROSS/BLUE SHIELD | Admitting: Podiatry

## 2015-04-19 VITALS — BP 137/83 | HR 60 | Resp 16

## 2015-04-19 DIAGNOSIS — M79672 Pain in left foot: Secondary | ICD-10-CM | POA: Diagnosis not present

## 2015-04-19 DIAGNOSIS — M779 Enthesopathy, unspecified: Secondary | ICD-10-CM | POA: Diagnosis not present

## 2015-04-19 DIAGNOSIS — L600 Ingrowing nail: Secondary | ICD-10-CM | POA: Diagnosis not present

## 2015-04-19 MED ORDER — TRIAMCINOLONE ACETONIDE 10 MG/ML IJ SUSP
10.0000 mg | Freq: Once | INTRAMUSCULAR | Status: AC
  Administered 2015-04-19: 10 mg

## 2015-04-19 NOTE — Patient Instructions (Signed)

## 2015-04-22 NOTE — Progress Notes (Signed)
Subjective:     Patient ID: [name], female   DOB: 12/05/1960, 54 y.o.   MRN: [id number]  HPI patient states I have pain in both my feet but the left is worse and it hurts on the outside of the foot and I have a painful ingrown toenail on my left big toe that I tried to trim and soak without relief   Review of Systems     Objective:   Physical Exam Neurovascular status intact muscle strength was adequate with incurvated lateral border left hallux that's painful and pain in the peroneal group and peroneal tertius group left with no indication of muscle strength loss but quite a bit of discomfort left over right    Assessment:     Inflammatory tendinitis and ingrown toenail condition    Plan:     H&P and conditions reviewed with patient. Did a careful sheath injection of the peroneal tendon group at the insertion base of fifth metatarsal 3 mg Kenalog 5 mg Xylocaine advised on ice and heat therapy and discussed correction of the ingrown toenail. Patient wants this fixed and I explained surgery and risk and today I infiltrated the left hallux 60 mg Xylocaine Marcaine mixture remove the lateral border exposed matrix and applied phenol 3 applications 30 seconds followed by alcohol lavage and sterile dressing. Gave instructions on soaks and reappoint

## 2015-04-25 ENCOUNTER — Telehealth: Payer: Self-pay | Admitting: *Deleted

## 2015-04-25 NOTE — Telephone Encounter (Signed)
Left message for patient at [phone] (Cell #) to check to see how they were doing from their ingrown toenail procedure that was performed on Thursday, April 19, 2015. Waiting for a response.

## 2015-05-03 ENCOUNTER — Ambulatory Visit: Payer: BLUE CROSS/BLUE SHIELD | Admitting: Podiatry

## 2016-03-10 DIAGNOSIS — E782 Mixed hyperlipidemia: Secondary | ICD-10-CM | POA: Insufficient documentation

## 2016-03-10 DIAGNOSIS — J302 Other seasonal allergic rhinitis: Secondary | ICD-10-CM | POA: Insufficient documentation

## 2016-03-10 DIAGNOSIS — Z6831 Body mass index (BMI) 31.0-31.9, adult: Secondary | ICD-10-CM

## 2016-03-10 DIAGNOSIS — E6609 Other obesity due to excess calories: Secondary | ICD-10-CM | POA: Insufficient documentation

## 2016-03-10 DIAGNOSIS — F419 Anxiety disorder, unspecified: Secondary | ICD-10-CM | POA: Insufficient documentation

## 2016-03-10 DIAGNOSIS — I10 Essential (primary) hypertension: Secondary | ICD-10-CM | POA: Insufficient documentation

## 2016-03-10 DIAGNOSIS — G4709 Other insomnia: Secondary | ICD-10-CM | POA: Insufficient documentation

## 2016-03-10 DIAGNOSIS — K219 Gastro-esophageal reflux disease without esophagitis: Secondary | ICD-10-CM | POA: Insufficient documentation

## 2016-07-28 ENCOUNTER — Ambulatory Visit: Payer: BLUE CROSS/BLUE SHIELD | Admitting: Podiatry

## 2016-07-31 ENCOUNTER — Ambulatory Visit (INDEPENDENT_AMBULATORY_CARE_PROVIDER_SITE_OTHER): Payer: BLUE CROSS/BLUE SHIELD | Admitting: Podiatry

## 2016-07-31 ENCOUNTER — Ambulatory Visit (INDEPENDENT_AMBULATORY_CARE_PROVIDER_SITE_OTHER): Payer: BLUE CROSS/BLUE SHIELD

## 2016-07-31 ENCOUNTER — Encounter: Payer: Self-pay | Admitting: Podiatry

## 2016-07-31 DIAGNOSIS — M7750 Other enthesopathy of unspecified foot: Secondary | ICD-10-CM

## 2016-07-31 DIAGNOSIS — M779 Enthesopathy, unspecified: Secondary | ICD-10-CM

## 2016-07-31 DIAGNOSIS — M79672 Pain in left foot: Secondary | ICD-10-CM

## 2016-07-31 MED ORDER — TRIAMCINOLONE ACETONIDE 10 MG/ML IJ SUSP
10.0000 mg | Freq: Once | INTRAMUSCULAR | Status: AC
  Administered 2016-07-31: 10 mg

## 2016-08-01 NOTE — Progress Notes (Signed)
Subjective:     Patient ID: [name], female   DOB: 06/18/1960, 56 y.o.   MRN: [id number]  HPI patient presents stating that the top outside of her left foot has been very sore and she does not remember specific injury   Review of Systems     Objective:   Physical Exam Neurovascular status intact negative Homans sign was noted with patient's left lateral foot being very inflamed and painful    Assessment:     Tendinitis left lateral foot with inflammation fluid and no indications of stress fracture clinically    Plan:     X-rays and condition reviewed and today I did careful steroid injection 3 mg Kenalog 5 mg Xylocaine advised on heat ice therapy and shoe gear modifications. Reappoint for us to recheck  X-ray indicates that there is no signs of stress fracture or advanced arthritis with this condition

## 2016-08-28 ENCOUNTER — Ambulatory Visit: Payer: BLUE CROSS/BLUE SHIELD | Admitting: Podiatry

## 2018-05-24 ENCOUNTER — Ambulatory Visit: Payer: BLUE CROSS/BLUE SHIELD | Admitting: Podiatry

## 2018-05-24 ENCOUNTER — Encounter: Payer: Self-pay | Admitting: Podiatry

## 2018-05-24 DIAGNOSIS — B351 Tinea unguium: Secondary | ICD-10-CM

## 2018-05-31 NOTE — Progress Notes (Signed)
Subjective:   Patient ID: [name], female   DOB: 58 y.o.   MRN: [id number]   HPI Patient presents with concerns about discoloration of the hallux nail bed bilateral stating that while is not hurting it is disconcerting to her and she does not remember specific injury   ROS      Objective:  Physical Exam  Neurovascular status intact with discoloration of the hallux nails bilateral that is localized with no indications currently that there is any proximal infection or other pathology.  There is moderate discoloration of the nailbeds and there is some dark discoloration associated with it     Assessment:  Probability for trauma of the nailbed with possible underlying mycotic infection     Plan:  H&P condition reviewed and today I recommended that we treated conservatively with wider shoes soaks therapy and I do not recommend this be treated as a fungal infection.  If it were to get worse or they were to become painful nail removal is possible in the future

## 2019-11-28 ENCOUNTER — Ambulatory Visit: Payer: Self-pay

## 2019-11-28 ENCOUNTER — Other Ambulatory Visit: Payer: Self-pay

## 2019-11-28 ENCOUNTER — Ambulatory Visit: Payer: BC Managed Care – PPO | Admitting: Family Medicine

## 2019-11-28 ENCOUNTER — Encounter: Payer: Self-pay | Admitting: Family Medicine

## 2019-11-28 DIAGNOSIS — G8929 Other chronic pain: Secondary | ICD-10-CM

## 2019-11-28 DIAGNOSIS — M25552 Pain in left hip: Secondary | ICD-10-CM

## 2019-11-28 DIAGNOSIS — M5442 Lumbago with sciatica, left side: Secondary | ICD-10-CM | POA: Diagnosis not present

## 2019-11-28 MED ORDER — TRAMADOL HCL 50 MG PO TABS: 50.0000 mg | ORAL_TABLET | Freq: Four times a day (QID) | ORAL | 0 refills | Status: DC | PRN

## 2019-11-28 NOTE — Progress Notes (Signed)
   Office Visit Note   Patient: [name]           Date of Birth: 1960/10/02           MRN: [id number] Visit Date: 11/28/2019 Requested by: Ladora Daniel, PA-C [address] [place],  [place] [address] PCP: Ladora Daniel, PA-C  Subjective: Chief Complaint  Patient presents with  . Lower Back - Pain    Pain left lower back x 1 year. Radiates down back/side of leg to the knee sometimes. NKI. Pain is always there, but hurts more some times than others. Flared up over the last few days. Hurts to lift the leg.    HPI: She is here with left posterior lateral hip pain.  Symptoms started about a year ago, no injury.  Pain in the hip radiating down the back and side of her leg to the knee sometimes.  No numbness or tingling, no bowel or bladder dysfunction.  Pain is worse when lying on her side or walking, better when leaning to the right side.  She tried Flexeril and anti-inflammatories with minimal improvement.               ROS:   All other systems were reviewed and are negative.  Objective: Vital Signs: There were no vitals taken for this visit.  Physical Exam:  General:  Alert and oriented, in no acute distress. Pulm:  Breathing unlabored. Psy:  Normal mood, congruent affect. Skin: No rash Low back: She is tender to palpation over the L5-S1 level in the midline.  She has pain in the left sciatic notch.  She is very tender over the greater trochanter and that seems to reproduce most of her pain.  Straight leg raise negative, no pain with internal hip rotation.  Lower extremity strength and reflexes are normal.  Imaging: XR Lumbar Spine 2-3 Views  Result Date: 11/28/2019 X-rays lumbar spine reveal moderate disc space narrowing at L5-S1.  No sign of compression fracture or neoplasm.  Hip joints are well-preserved.   Assessment & Plan: 1.  Left posterior lateral hip pain, possibly greater trochanter syndrome but cannot rule out lumbar foraminal stenosis. -Discussed options with her  and she wants to try cortisone injection.  If this does not help, then physical therapy.     Procedures: Left hip greater trochanter injection: After sterile prep with Betadine, injected 8 cc 1% lidocaine without epinephrine and 40 mg methylprednisolone into the area of maximum tenderness.    PMFS History: Patient Active Problem List   Diagnosis Date Noted  . Anxiety 03/10/2016  . Chronic seasonal allergic rhinitis 03/10/2016  . Class 1 obesity due to excess calories without serious comorbidity with body mass index (BMI) of 31.0 to 31.9 in adult 03/10/2016  . Essential hypertension 03/10/2016  . Gastroesophageal reflux disease without esophagitis 03/10/2016  . Mixed hyperlipidemia 03/10/2016  . Other insomnia 03/10/2016   History reviewed. No pertinent past medical history.  History reviewed. No pertinent family history.  History reviewed. No pertinent surgical history. Social History   Occupational History  . Not on file  Tobacco Use  . Smoking status: Never Smoker  . Smokeless tobacco: Never Used  Substance and Sexual Activity  . Alcohol use: No    Alcohol/week: 0.0 standard drinks  . Drug use: Not on file  . Sexual activity: Not on file

## 2020-01-25 ENCOUNTER — Other Ambulatory Visit: Payer: Self-pay | Admitting: Family Medicine

## 2020-02-01 ENCOUNTER — Ambulatory Visit: Payer: BC Managed Care – PPO | Admitting: Family Medicine

## 2020-02-06 ENCOUNTER — Other Ambulatory Visit: Payer: Self-pay

## 2020-02-06 ENCOUNTER — Encounter: Payer: Self-pay | Admitting: Family Medicine

## 2020-02-06 ENCOUNTER — Ambulatory Visit: Payer: BC Managed Care – PPO | Admitting: Family Medicine

## 2020-02-06 DIAGNOSIS — G8929 Other chronic pain: Secondary | ICD-10-CM | POA: Diagnosis not present

## 2020-02-06 DIAGNOSIS — M25559 Pain in unspecified hip: Secondary | ICD-10-CM | POA: Diagnosis not present

## 2020-02-06 DIAGNOSIS — M5442 Lumbago with sciatica, left side: Secondary | ICD-10-CM

## 2020-02-06 NOTE — Progress Notes (Signed)
Not any better Shot didn't help long

## 2020-02-06 NOTE — Progress Notes (Signed)
   Office Visit Note   Patient: [name]           Date of Birth: Jan 28, 1961           MRN: [id number] Visit Date: 02/06/2020 Requested by: Ladora Daniel, PA-C [address] [place],  [place] [address] PCP: Ladora Daniel, PA-C  Subjective: Chief Complaint  Patient presents with  . Left Hip - Pain    HPI: She is here for follow-up chronic left posterior lateral hip pain and low back pain.  Last visit we injected the greater trochanter area.  She states that it gave some relief, but not complete.  It has worn off again.  She has pain in the lower lumbar area and pain on the lateral aspect of her hip with radiation into the lateral thigh.  Pain keeps her from sleeping well at night.  Pain is worse after being on her feet all day.  Medications do not really help.  She went to physical therapy a couple times but that was a while ago, she did not notice much difference at that time.              ROS:   All other systems were reviewed and are negative.  Objective: Vital Signs: There were no vitals taken for this visit.  Physical Exam:  General:  Alert and oriented, in no acute distress. Pulm:  Breathing unlabored. Psy:  Normal mood, congruent affect.  Low back: She is tender to palpation near the left SI joint.  She has pain in the left sciatic notch.  Lower extremity strength and reflexes remain normal. Left hip: She is tender on the posterior lateral aspect of the greater trochanter.  She has good range of motion and no significant pain with passive internal hip rotation.   Imaging: No results found.  Assessment & Plan: 1. chronic left posterior lateral hip pain, possibly greater trochanter syndrome versus lumbar foraminal stenosis. -Discussed options with her and elected to proceed with MRI scan of the lumbar spine and left hip.  Depending on the results, could try physical therapy or possibly refer for lumbar epidural injection.     Procedures: No procedures performed  No  notes on file     PMFS History: Patient Active Problem List   Diagnosis Date Noted  . Anxiety 03/10/2016  . Chronic seasonal allergic rhinitis 03/10/2016  . Class 1 obesity due to excess calories without serious comorbidity with body mass index (BMI) of 31.0 to 31.9 in adult 03/10/2016  . Essential hypertension 03/10/2016  . Gastroesophageal reflux disease without esophagitis 03/10/2016  . Mixed hyperlipidemia 03/10/2016  . Other insomnia 03/10/2016   History reviewed. No pertinent past medical history.  History reviewed. No pertinent family history.  History reviewed. No pertinent surgical history. Social History   Occupational History  . Not on file  Tobacco Use  . Smoking status: Never Smoker  . Smokeless tobacco: Never Used  Substance and Sexual Activity  . Alcohol use: No    Alcohol/week: 0.0 standard drinks  . Drug use: Not on file  . Sexual activity: Not on file

## 2020-02-19 ENCOUNTER — Other Ambulatory Visit: Payer: Self-pay

## 2020-02-19 ENCOUNTER — Ambulatory Visit
Admission: RE | Admit: 2020-02-19 | Discharge: 2020-02-19 | Disposition: A | Discharge status: Home / Self Care | Payer: BC Managed Care – PPO | Source: Ambulatory Visit | Attending: Family Medicine | Admitting: Family Medicine

## 2020-02-19 DIAGNOSIS — M25559 Pain in unspecified hip: Secondary | ICD-10-CM

## 2020-02-19 DIAGNOSIS — G8929 Other chronic pain: Secondary | ICD-10-CM

## 2020-02-20 ENCOUNTER — Telehealth: Payer: Self-pay | Admitting: Family Medicine

## 2020-02-20 DIAGNOSIS — G8929 Other chronic pain: Secondary | ICD-10-CM

## 2020-02-20 NOTE — Telephone Encounter (Signed)
MRI scans show two potential causes of left hip pain:  - Lumbar MRI shows a left-sided disc protrusion at L3-4 which contacts the left L3 nerve root.  - Hip MRI shows bursitis.

## 2020-02-21 NOTE — Telephone Encounter (Signed)
Called and left voice mail to call me back.

## 2020-02-22 ENCOUNTER — Telehealth: Payer: Self-pay

## 2020-02-22 NOTE — Telephone Encounter (Signed)
I called and advised the patient of the results and options. She would like to try the [facility] first.

## 2020-02-22 NOTE — Telephone Encounter (Signed)
Patient called in wanting to know if we got her mri results yet

## 2020-02-22 NOTE — Telephone Encounter (Signed)
Orders placed.

## 2020-02-22 NOTE — Telephone Encounter (Signed)
I called the patient - see other message from today. 

## 2020-02-22 NOTE — Addendum Note (Signed)
Addended by: Lillia Carmel on: 02/22/2020 05:11 PM   Modules accepted: Orders

## 2020-02-27 ENCOUNTER — Telehealth: Payer: Self-pay

## 2020-02-27 NOTE — Telephone Encounter (Signed)
Called pt back and sch 11/1

## 2020-02-27 NOTE — Telephone Encounter (Signed)
Patient called in returning missed call to get sch withh dr Alvester Morin

## 2020-03-14 ENCOUNTER — Telehealth: Payer: Self-pay

## 2020-03-14 NOTE — Telephone Encounter (Signed)
Left message #1

## 2020-03-14 NOTE — Telephone Encounter (Signed)
Patient called she needs her appt. To be rescheduled to mid-November. Call back:[phone]

## 2020-03-15 NOTE — Telephone Encounter (Signed)
Pt called back and resch for 11/22

## 2020-03-15 NOTE — Telephone Encounter (Signed)
Called pt and lvm #2 

## 2020-03-19 ENCOUNTER — Ambulatory Visit: Payer: BC Managed Care – PPO | Admitting: Physical Medicine and Rehabilitation

## 2020-03-19 ENCOUNTER — Telehealth: Payer: Self-pay | Admitting: Physical Medicine and Rehabilitation

## 2020-03-19 NOTE — Telephone Encounter (Signed)
Pt called asking to reschedule her appt currently set for 04/09/20 to 03/29/20( in the morning preferably but will take any time on the 11th) pt would like a CB but if she doesn't answer leave VM with appt time   [phone]

## 2020-03-19 NOTE — Telephone Encounter (Signed)
Rescheduled

## 2020-03-29 ENCOUNTER — Ambulatory Visit (INDEPENDENT_AMBULATORY_CARE_PROVIDER_SITE_OTHER): Payer: BC Managed Care – PPO | Admitting: Physical Medicine and Rehabilitation

## 2020-03-29 ENCOUNTER — Encounter: Payer: Self-pay | Admitting: Physical Medicine and Rehabilitation

## 2020-03-29 ENCOUNTER — Other Ambulatory Visit: Payer: Self-pay

## 2020-03-29 ENCOUNTER — Ambulatory Visit: Payer: Self-pay

## 2020-03-29 VITALS — BP 132/78 | HR 68

## 2020-03-29 DIAGNOSIS — M5416 Radiculopathy, lumbar region: Secondary | ICD-10-CM | POA: Diagnosis not present

## 2020-03-29 MED ORDER — DEXAMETHASONE SODIUM PHOSPHATE 10 MG/ML IJ SOLN
15.0000 mg | Freq: Once | INTRAMUSCULAR | Status: AC
  Administered 2020-03-29: 15 mg

## 2020-03-29 NOTE — Progress Notes (Signed)
Pt state lower back that travels down her left buttocks to her left knee. Pt state walking and standing for a long period of time makes the pain worse. Pt sate she has to lay down and take pain meds to ease the pain.  Numeric Pain Rating Scale and Functional Assessment Average Pain 8   In the last MONTH (on 0-10 scale) has pain interfered with the following?  1. General activity like being  able to carry out your everyday physical activities such as walking, climbing stairs, carrying groceries, or moving a chair?  Rating(10)   +Driver, -BT, -Dye Allergies.

## 2020-03-29 NOTE — Procedures (Signed)
Lumbosacral Transforaminal Epidural Steroid Injection - Sub-Pedicular Approach with Fluoroscopic Guidance  Patient: [name]      Date of Birth: 30-Oct-1960 MRN: [id number] PCP: Ladora Daniel, PA-C      Visit Date: 03/29/2020   Universal Protocol:    Date/Time: 03/29/2020  Consent Given By: the patient  Position: PRONE  Additional Comments: Vital signs were monitored before and after the procedure. Patient was prepped and draped in the usual sterile fashion. The correct patient, procedure, and site was verified.   Injection Procedure Details:   Procedure diagnoses:  1. Lumbar radiculopathy      Meds Administered:  Meds ordered this encounter  Medications  . dexamethasone (DECADRON) injection 15 mg    Laterality: Left  Location/Site:  L3-L4  Needle:5.0 in., 22 ga.  Short bevel or Quincke spinal needle  Needle Placement: Transforaminal  Findings:    -Comments: Excellent flow of contrast along the nerve, nerve root and into the epidural space.  Procedure Details: After squaring off the end-plates to get a true AP view, the C-arm was positioned so that an oblique view of the foramen as noted above was visualized. The target area is just inferior to the "nose of the scotty dog" or sub pedicular. The soft tissues overlying this structure were infiltrated with 2-3 ml. of 1% Lidocaine without Epinephrine.  The spinal needle was inserted toward the target using a "trajectory" view along the fluoroscope beam.  Under AP and lateral visualization, the needle was advanced so it did not puncture dura and was located close the 6 O'Clock position of the pedical in AP tracterory. Biplanar projections were used to confirm position. Aspiration was confirmed to be negative for CSF and/or blood. A 1-2 ml. volume of Isovue-250 was injected and flow of contrast was noted at each level. Radiographs were obtained for documentation purposes.   After attaining the desired flow of contrast  documented above, a 0.5 to 1.0 ml test dose of 0.25% Marcaine was injected into each respective transforaminal space.  The patient was observed for 90 seconds post injection.  After no sensory deficits were reported, and normal lower extremity motor function was noted,   the above injectate was administered so that equal amounts of the injectate were placed at each foramen (level) into the transforaminal epidural space.   Additional Comments:  The patient tolerated the procedure well Dressing: 2 x 2 sterile gauze and Band-Aid    Post-procedure details: Patient was observed during the procedure. Post-procedure instructions were reviewed.  Patient left the clinic in stable condition.

## 2020-04-09 ENCOUNTER — Ambulatory Visit: Payer: BC Managed Care – PPO | Admitting: Physical Medicine and Rehabilitation

## 2020-05-21 NOTE — Progress Notes (Signed)
BRAYLI KLINGBEIL - 60 y.o. female MRN [id number]  Date of birth: 11/01/1960  Office Visit Note: Visit Date: 03/29/2020 PCP: Ladora Daniel, PA-C Referred by: Ladora Daniel, PA-C  Subjective: Chief Complaint  Patient presents with  . Lower Back - Pain  . Left Knee - Pain   HPI:  ZANA BIANCARDI is a 60 y.o. female who comes in today at the request of Dr. Lavada Mesi for planned Left L3-L4 Lumbar epidural steroid injection with fluoroscopic guidance.  The patient has failed conservative care including home exercise, medications, time and activity modification.  This injection will be diagnostic and hopefully therapeutic.  Please see requesting physician notes for further details and justification.  MRI reviewed with images and spine model.  MRI reviewed in the note below.    ROS Otherwise per HPI.  Assessment & Plan: Visit Diagnoses:    ICD-10-CM   1. Lumbar radiculopathy  M54.16 XR C-ARM NO REPORT    Epidural Steroid injection    dexamethasone (DECADRON) injection 15 mg    Plan: No additional findings.   Meds & Orders:  Meds ordered this encounter  Medications  . dexamethasone (DECADRON) injection 15 mg    Orders Placed This Encounter  Procedures  . XR C-ARM NO REPORT  . Epidural Steroid injection    Follow-up: Return for visit to requesting physician as needed.   Procedures: No procedures performed  Lumbosacral Transforaminal Epidural Steroid Injection - Sub-Pedicular Approach with Fluoroscopic Guidance  Patient: NOVALIE LEAMY      Date of Birth: [birth year] MRN: [id number] PCP: Ladora Daniel, PA-C      Visit Date: 03/29/2020   Universal Protocol:    Date/Time: 03/29/2020  Consent Given By: the patient  Position: PRONE  Additional Comments: Vital signs were monitored before and after the procedure. Patient was prepped and draped in the usual sterile fashion. The correct patient, procedure, and site was verified.   Injection Procedure Details:   Procedure  diagnoses:  1. Lumbar radiculopathy      Meds Administered:  Meds ordered this encounter  Medications  . dexamethasone (DECADRON) injection 15 mg    Laterality: Left  Location/Site:  L3-L4  Needle:5.0 in., 22 ga.  Short bevel or Quincke spinal needle  Needle Placement: Transforaminal  Findings:    -Comments: Excellent flow of contrast along the nerve, nerve root and into the epidural space.  Procedure Details: After squaring off the end-plates to get a true AP view, the C-arm was positioned so that an oblique view of the foramen as noted above was visualized. The target area is just inferior to the "nose of the scotty dog" or sub pedicular. The soft tissues overlying this structure were infiltrated with 2-3 ml. of 1% Lidocaine without Epinephrine.  The spinal needle was inserted toward the target using a "trajectory" view along the fluoroscope beam.  Under AP and lateral visualization, the needle was advanced so it did not puncture dura and was located close the 6 O'Clock position of the pedical in AP tracterory. Biplanar projections were used to confirm position. Aspiration was confirmed to be negative for CSF and/or blood. A 1-2 ml. volume of Isovue-250 was injected and flow of contrast was noted at each level. Radiographs were obtained for documentation purposes.   After attaining the desired flow of contrast documented above, a 0.5 to 1.0 ml test dose of 0.25% Marcaine was injected into each respective transforaminal space.  The patient was observed for 90 seconds post injection.  After no  sensory deficits were reported, and normal lower extremity motor function was noted,   the above injectate was administered so that equal amounts of the injectate were placed at each foramen (level) into the transforaminal epidural space.   Additional Comments:  The patient tolerated the procedure well Dressing: 2 x 2 sterile gauze and Band-Aid    Post-procedure details: Patient was  observed during the procedure. Post-procedure instructions were reviewed.  Patient left the clinic in stable condition.      Clinical History: MRI LUMBAR SPINE WITHOUT CONTRAST  TECHNIQUE: Multiplanar, multisequence MR imaging of the lumbar spine was performed. No intravenous contrast was administered.  COMPARISON:  Lumbar spine radiographs 11/28/2019  FINDINGS: Segmentation:  Standard.  Alignment:  Normal.  Vertebrae: No fracture, suspicious osseous lesion, or significant marrow edema.  Conus medullaris and cauda equina: Conus extends to the L1 level. Conus and cauda equina appear normal.  Paraspinal and other soft tissues: Unremarkable.  Disc levels:  L1-2 and L2-3: Negative.  L3-4: A left foraminal and extraforaminal disc protrusion slightly displaces the exiting left L3 nerve root and minimally narrows the left neural foramen without evidence of neural compression. Patent spinal canal and right neural foramen.  L4-5: Mild to moderate left greater than right facet and ligamentum flavum hypertrophy and at most minimal disc bulging result in mild left lateral recess stenosis without spinal or neural foraminal stenosis.  L5-S1: Disc desiccation and moderate disc space narrowing. Disc bulging greater to the left, endplate spurring, disc space height loss, and mild facet hypertrophy result in mild left neural foraminal stenosis without spinal stenosis.  IMPRESSION: 1. Lumbar disc degeneration greatest at L5-S1 where there is mild left neural foraminal stenosis. 2. Left foraminal and extraforaminal disc protrusion at L3-4 mildly displacing the left L3 nerve root. 3. Mild left lateral recess stenosis at L4-5 due to facet hypertrophy.   Electronically Signed   By: Sebastian Ache M.D.   On: 02/19/2020 07:54     Objective:  VS:  HT:    WT:   BMI:     BP:132/78  HR:68bpm  TEMP: ( )  RESP:  Physical Exam Constitutional:      General: She is  not in acute distress.    Appearance: Normal appearance. She is not ill-appearing.  HENT:     Head: Normocephalic and atraumatic.     Right Ear: External ear normal.     Left Ear: External ear normal.  Eyes:     Extraocular Movements: Extraocular movements intact.  Cardiovascular:     Rate and Rhythm: Normal rate.     Pulses: Normal pulses.  Musculoskeletal:     Right lower leg: No edema.     Left lower leg: No edema.     Comments: Patient has good distal strength with no pain over the greater trochanters.  No clonus or focal weakness.  Skin:    Findings: No erythema, lesion or rash.  Neurological:     General: No focal deficit present.     Mental Status: She is alert and oriented to person, place, and time.     Sensory: No sensory deficit.     Motor: No weakness or abnormal muscle tone.     Coordination: Coordination normal.  Psychiatric:        Mood and Affect: Mood normal.        Behavior: Behavior normal.      Imaging: No results found.

## 2020-08-08 ENCOUNTER — Other Ambulatory Visit: Payer: Self-pay | Admitting: Family Medicine

## 2020-10-28 ENCOUNTER — Emergency Department (HOSPITAL_COMMUNITY)
Admission: EM | Admit: 2020-10-28 | Discharge: 2020-10-28 | Disposition: A | Discharge status: Home / Self Care | Payer: BC Managed Care – PPO | Attending: Emergency Medicine | Admitting: Emergency Medicine

## 2020-10-28 ENCOUNTER — Encounter (HOSPITAL_COMMUNITY): Payer: Self-pay | Admitting: Emergency Medicine

## 2020-10-28 ENCOUNTER — Other Ambulatory Visit: Payer: Self-pay

## 2020-10-28 DIAGNOSIS — Z23 Encounter for immunization: Secondary | ICD-10-CM | POA: Diagnosis not present

## 2020-10-28 DIAGNOSIS — F322 Major depressive disorder, single episode, severe without psychotic features: Secondary | ICD-10-CM

## 2020-10-28 DIAGNOSIS — Z79899 Other long term (current) drug therapy: Secondary | ICD-10-CM | POA: Diagnosis not present

## 2020-10-28 DIAGNOSIS — I10 Essential (primary) hypertension: Secondary | ICD-10-CM | POA: Diagnosis not present

## 2020-10-28 DIAGNOSIS — Y900 Blood alcohol level of less than 20 mg/100 ml: Secondary | ICD-10-CM | POA: Insufficient documentation

## 2020-10-28 HISTORY — DX: Essential (primary) hypertension: I10

## 2020-10-28 HISTORY — DX: Depression, unspecified: F32.A

## 2020-10-28 LAB — CBC WITH DIFFERENTIAL/PLATELET
Abs Immature Granulocytes: 0.01 10*3/uL (ref 0.00–0.07)
Basophils Absolute: 0.1 10*3/uL (ref 0.0–0.1)
Basophils Relative: 1 %
Eosinophils Absolute: 0.1 10*3/uL (ref 0.0–0.5)
Eosinophils Relative: 2 %
HCT: 40 % (ref 36.0–46.0)
Hemoglobin: 13.1 g/dL (ref 12.0–15.0)
Immature Granulocytes: 0 %
Lymphocytes Relative: 33 %
Lymphs Abs: 1.8 10*3/uL (ref 0.7–4.0)
MCH: 28.9 pg (ref 26.0–34.0)
MCHC: 32.8 g/dL (ref 30.0–36.0)
MCV: 88.1 fL (ref 80.0–100.0)
Monocytes Absolute: 0.3 10*3/uL (ref 0.1–1.0)
Monocytes Relative: 6 %
Neutro Abs: 3.1 10*3/uL (ref 1.7–7.7)
Neutrophils Relative %: 58 %
Platelets: 237 10*3/uL (ref 150–400)
RBC: 4.54 MIL/uL (ref 3.87–5.11)
RDW: 13 % (ref 11.5–15.5)
WBC: 5.3 10*3/uL (ref 4.0–10.5)
nRBC: 0 % (ref 0.0–0.2)

## 2020-10-28 LAB — COMPREHENSIVE METABOLIC PANEL
ALT: 15 U/L (ref 0–44)
AST: 17 U/L (ref 15–41)
Albumin: 3.9 g/dL (ref 3.5–5.0)
Alkaline Phosphatase: 56 U/L (ref 38–126)
Anion gap: 10 (ref 5–15)
BUN: 15 mg/dL (ref 6–20)
CO2: 27 mmol/L (ref 22–32)
Calcium: 9.1 mg/dL (ref 8.9–10.3)
Chloride: 103 mmol/L (ref 98–111)
Creatinine, Ser: 0.69 mg/dL (ref 0.44–1.00)
GFR, Estimated: >60 mL/min (ref >60)
Glucose, Bld: 99 mg/dL (ref 70–99)
Potassium: 3.5 mmol/L (ref 3.5–5.1)
Sodium: 140 mmol/L (ref 135–145)
Total Bilirubin: 0.9 mg/dL (ref 0.3–1.2)
Total Protein: 6.6 g/dL (ref 6.5–8.1)

## 2020-10-28 LAB — ETHANOL: Alcohol, Ethyl (B): <10 mg/dL (ref <10)

## 2020-10-28 LAB — SALICYLATE LEVEL: Salicylate Lvl: <7 mg/dL — ABNORMAL LOW (ref 7.0–30.0)

## 2020-10-28 LAB — ACETAMINOPHEN LEVEL: Acetaminophen (Tylenol), Serum: <10 ug/mL — ABNORMAL LOW (ref 10–30)

## 2020-10-28 MED ORDER — TETANUS-DIPHTH-ACELL PERTUSSIS 5-2.5-18.5 LF-MCG/0.5 IM SUSY
0.5000 mL | PREFILLED_SYRINGE | Freq: Once | INTRAMUSCULAR | Status: AC
  Administered 2020-10-28: 01:00:00 0.5 mL via INTRAMUSCULAR
  Filled 2020-10-28: qty 0.5

## 2020-10-28 NOTE — BH Assessment (Signed)
Comprehensive Clinical Assessment (CCA) Note  10/28/2020 [name] [id number]  DISPOSITION: Gave clinical report to [name] , NP who determined Pt does not meet criteria for inpatient psychiatric treatment. Notified Dr. Glynn OctaveStephen Rancour, MD  and Dominique KingfisherElizabeth Teasley- Sinks RN of disposition recommendation and the sitter utilization recommendation.  Flow[facility] Row [facility]  C-SSRS RISK CATEGORY No Risk      The patient demonstrates the following risk factors for suicide: Chronic risk factors for suicide include: N/A. Acute risk factors for suicide include: N/A. Protective factors for this patient include: positive social support and positive therapeutic relationship. Considering these factors, the overall suicide risk at this point appears to be no risk.. Patient is not appropriate for outpatient follow up.  Pt is a 60 yo female who presents voluntarily  to  A{PED ?via police  .Pt was accompanied by police reporting symptoms of depression with suicidal ideation. Pt has a history of depression  and says she was referred for assessment by self . Pt reports medication compliance. Pt denies current suicidal ideation with  no plans of self harm and no past attempts. Patient endorsed making a small cut on wrist after feeling depressed after arguing with her husband.  Pt denies homicidal ideation/ history of violence. Pt denies auditory & visual hallucinations or other symptoms of psychosis. Pt states current stressors include recent arguments with husband. Patient reports feeling a depressed because her husband is a Naval architecttruck driver and he was not coming home. Patient stated she did something really dumb and will never do anything like that again.   Pt lives  with husband and supports include family . Pt denies a hx of abuse and trauma. Pt denies  there is a family history of mental health. Pt's work history includes [employer] for past 29 years Pt has fair  insight and judgment. Pt's memory is intact and denies any legal history includes .    Protective factors against suicide include good family support, no current suicidal ideation, future orientation, therapeutic relationship, no access to firearms, no current psychotic symptoms and no prior attempts.   Pt denies OP/ IP history. Pt denies alcohol/ substance abuse.    MSE: Pt is casually dressed, alert, oriented x5 with normal speech and normal motor behavior. Eye contact is good. Pt's mood is normal  and affect is euthymic  Affect is congruent with mood. Thought process is coherent and relevant. There is no indication Pt is currently responding to internal stimuli or experiencing delusional thought content. Pt was cooperative throughout assessment.   DISPOSITION: Gave clinical report to [name] , NP who determined Pt does not meet criteria for inpatient psychiatric treatment. Notified Dr. Glynn OctaveStephen Rancour, MD  and Dominique KingfisherElizabeth Teasley- Sinks RN of disposition recommendation and the sitter utilization recommendation.    Chief Complaint:  Chief Complaint  Patient presents with   Depression   Visit Diagnosis: Major Depressive Disorder , single episode with out psychotic features    CCA Screening, Triage and Referral (STR)  Patient Reported Information How did you hear about us? No data recorded What Is the Reason for Your Visit/Call Today? Pt brought in voluntary by police after husband called due to pt sending him photo of place where she had attempted to cut her arm. Pt states that she does have SI thoughts some times but her PCP is setting up her some counseling.  How Long Has This Been Causing You Problems? 1 wk - 1 month  What Do You Feel Would Help You the Most Today? Treatment for Depression or other mood problem   Have You Recently Had Any Thoughts About Hurting Yourself? No  Are You Planning to Commit Suicide/Harm Yourself At This time? No   Have you Recently Had Thoughts  About Hurting Someone [name]? No  Are You Planning to Harm Someone at This Time? No  Explanation: No data recorded  Have You Used Any Alcohol or Drugs in the Past 24 Hours? No data recorded How Long Ago Did You Use Drugs or Alcohol? No data recorded What Did You Use and How Much? No data recorded  Do You Currently Have a Therapist/Psychiatrist? No  Name of Therapist/Psychiatrist: No data recorded  Have You Been Recently Discharged From Any Office Practice or Programs? No  Explanation of Discharge From Practice/Program: No data recorded    CCA Screening Triage Referral Assessment Type of Contact: Tele-Assessment  Telemedicine Service Delivery:   Is this Initial or Reassessment? Initial Assessment  Date Telepsych consult ordered in [facility]:  10/28/20  Time Telepsych consult ordered in [facility]:  0047  Location of Assessment: [facility]  Provider Location: [facility] Assessment Services   Collateral Involvement: No data recorded  Does Patient Have a Court Appointed Legal Guardian? No data recorded Name and Contact of Legal Guardian: No data recorded If Minor and Not Living with Parent(s), Who has Custody? No data recorded Is CPS involved or ever been involved? Never  Is APS involved or ever been involved? Never   Patient Determined To Be At Risk for Harm To Self or Others Based on Review of Patient Reported Information or Presenting Complaint? No  Method: No data recorded Availability of Means: No data recorded Intent: No data recorded Notification Required: No data recorded Additional Information for Danger to Others Potential: No data recorded Additional Comments for Danger to Others Potential: No data recorded Are There Guns or Other Weapons in Your Home? No data recorded Types of Guns/Weapons: No data recorded Are These Weapons Safely Secured?                            No data recorded Who Could Verify You Are Able To Have These Secured: No data recorded Do You Have any  Outstanding Charges, Pending Court Dates, Parole/Probation? No data recorded Contacted To Inform of Risk of Harm To Self or Others: No data recorded   Does Patient Present under Involuntary Commitment? No  IVC Papers Initial File Date: No data recorded  [place] of Residence: [place]   Patient Currently Receiving the Following Services: Not Receiving Services   Determination of Need: Routine (7 days)   Options For Referral: Medication Management; Outpatient Therapy     CCA Biopsychosocial Patient Reported Schizophrenia/Schizoaffective Diagnosis in Past: No   Strengths: No data recorded  Mental Health Symptoms Depression:   Change in energy/activity   Duration of Depressive symptoms:  Duration of Depressive Symptoms: Greater than two weeks   Mania:   N/A   Anxiety:    N/A   Psychosis:   None   Duration of Psychotic symptoms:    Trauma:   N/A   Obsessions:   N/A   Compulsions:   N/A   Inattention:   N/A   Hyperactivity/Impulsivity:   N/A   Oppositional/Defiant Behaviors:   N/A   Emotional Irregularity:   Mood lability   Other Mood/Personality Symptoms:  No data recorded   Mental Status Exam Appearance and  self-care  Stature:   Average   Weight:   Average weight   Clothing:   Neat/clean   Grooming:   Normal   Cosmetic use:   Age appropriate   Posture/gait:   Normal   Motor activity:   Not Remarkable   Sensorium  Attention:   Normal   Concentration:   Normal   Orientation:   X5   Recall/memory:   Normal   Affect and Mood  Affect:   Appropriate   Mood:   Euthymic   Relating  Eye contact:   Normal   Facial expression:   Responsive   Attitude toward examiner:   Cooperative   Thought and Language  Speech flow:  Clear and Coherent   Thought content:   Appropriate to Mood and Circumstances   Preoccupation:   None   Hallucinations:   None   Organization:  No data recorded  [employer] of Knowledge:   Average   Intelligence:   Average   Abstraction:   Normal   Judgement:   Fair   Dance movement psychotherapist:   Adequate   Insight:   Fair   Decision Making:   Impulsive   Social Functioning  Social Maturity:   Responsible   Social Judgement:   Normal   Stress  Stressors:   Family conflict   Coping Ability:   Normal   Skill Deficits:   Decision making   Supports:   Family     Religion: Religion/Spirituality Are You A Religious Person?: No  Leisure/Recreation:    Exercise/Diet: Exercise/Diet Do You Exercise?: No Have You Gained or Lost A Significant Amount of Weight in the Past Six Months?: No Do You Follow a Special Diet?: No Do You Have Any Trouble Sleeping?: No   CCA Employment/Education Employment/Work Situation: Employment / Work Situation Employment Situation: Employed Work Stressors: None Patient's Job has Been Impacted by Current Illness: No Has Patient ever Been in Equities trader?: No  Education: Education Is Patient Currently Attending School?: No   CCA Family/Childhood History Family and Relationship History: Family history Marital status: Married What types of issues is patient dealing with in the relationship?: martial conflict Does patient have children?: Yes  Childhood History:  Childhood History By whom was/is the patient raised?: Both parents Did patient suffer any verbal/emotional/physical/sexual abuse as a child?: No Did patient suffer from severe childhood neglect?: No Has patient ever been sexually abused/assaulted/raped as an adolescent or adult?: No Was the patient ever a victim of a crime or a disaster?: No Witnessed domestic violence?: No Has patient been affected by domestic violence as an adult?: No  Child/Adolescent Assessment:     CCA Substance Use Alcohol/Drug Use: Alcohol / Drug Use Pain Medications: SEE MAR Prescriptions: SEE MAR Over the Counter: SEE MAR History of alcohol / drug  use?: No history of alcohol / drug abuse                         ASAM's:  Six Dimensions of Multidimensional Assessment  Dimension 1:  Acute Intoxication and/or Withdrawal Potential:      Dimension 2:  Biomedical Conditions and Complications:      Dimension 3:  Emotional, Behavioral, or Cognitive Conditions and Complications:     Dimension 4:  Readiness to Change:     Dimension 5:  Relapse, Continued use, or Continued Problem Potential:     Dimension 6:  Recovery/Living Environment:     ASAM Severity Score:  ASAM Recommended Level of Treatment:     Substance use Disorder (SUD)    Recommendations for Services/Supports/Treatments:    Discharge Disposition:    DSM5 Diagnoses: Patient Active Problem List   Diagnosis Date Noted   Anxiety 03/10/2016   Chronic seasonal allergic rhinitis 03/10/2016   Class 1 obesity due to excess calories without serious comorbidity with body mass index (BMI) of 31.0 to 31.9 in adult 03/10/2016   Essential hypertension 03/10/2016   Gastroesophageal reflux disease without esophagitis 03/10/2016   Mixed hyperlipidemia 03/10/2016   Other insomnia 03/10/2016     Referrals to Alternative Service(s): Referred to Alternative Service(s):   Place:   Date:   Time:    Referred to Alternative Service(s):   Place:   Date:   Time:    Referred to Alternative Service(s):   Place:   Date:   Time:    Referred to Alternative Service(s):   Place:   Date:   Time:     Rachel Moulds, [place]

## 2020-10-28 NOTE — BH Assessment (Signed)
DISPOSITION: Gave clinical report to Nira Conn , NP who determined Pt does not meet criteria for inpatient psychiatric treatment. Notified Dr. Glynn Octave, MD  and Kenton Kingfisher- Sinks , RN of disposition recommendation and the sitter utilization recommendation

## 2020-10-28 NOTE — ED Triage Notes (Signed)
Pt brought in voluntary by police after husband called due to pt sending him photo of place where she had attempted to cut her arm. Pt states that she does have SI thoughts some times but her PCP is setting up her some counseling.

## 2020-10-28 NOTE — Discharge Instructions (Addendum)
Follow-up with your doctor as well as a counselor from the attached list.  Take your Lexapro as prescribed.  Return to the ED immediately if you develop thoughts of wanting to hurt yourself or anyone else.

## 2020-10-28 NOTE — BH Assessment (Signed)
TTS consult completed 

## 2020-10-28 NOTE — ED Provider Notes (Signed)
[facility] EMERGENCY DEPARTMENT Provider Note   CSN: [id number] Arrival date & time: 10/28/20  0007     History Chief Complaint  Patient presents with   Medical Clearance    [name] is a 60 y.o. female.  Patient with a history of depression and hypertension taking Cymbalta brought in voluntary by the police department.  She called her husband tonight showing him a picture where she tried to cut herself with a box cutter.  States she did this for attention and wanted her husband to come home.  States she is not trying to hurt herself.  Has had fleeting suicidal thoughts in the past but no definite plan at this time.  States she does not want herself currently.  She denies any homicidal thoughts or hallucinations.  Denies hearing any voices.  Denies any drug or alcohol abuse.  Has been taking Lexapro (or cymbalta) as prescribed by her PCP for several years.  She has not followed up with a therapist or counselor. States she does not want hurt herself currently.  Does not want hurt anyone else is not hearing any voices.  The history is provided by the patient.      Past Medical History:  Diagnosis Date   Depression    Hypertension     Patient Active Problem List   Diagnosis Date Noted   Anxiety 03/10/2016   Chronic seasonal allergic rhinitis 03/10/2016   Class 1 obesity due to excess calories without serious comorbidity with body mass index (BMI) of 31.0 to 31.9 in adult 03/10/2016   Essential hypertension 03/10/2016   Gastroesophageal reflux disease without esophagitis 03/10/2016   Mixed hyperlipidemia 03/10/2016   Other insomnia 03/10/2016    History reviewed. No pertinent surgical history.   OB History   No obstetric history on file.     History reviewed. No pertinent family history.  Social History   Tobacco Use   Smoking status: Never   Smokeless tobacco: Never  Substance Use Topics   Alcohol use: No    Alcohol/week: 0.0 standard drinks    Home  Medications Prior to Admission medications   Medication Sig Start Date End Date Taking? Authorizing Provider  ALPRAZolam Prudy Feeler) 0.5 MG tablet TAKE 1/2-1 TABLET TWICE DAILY AS NEEDED 05/16/16   [provider]  amLODipine (NORVASC) 10 MG tablet TAKE 1 TABLET(10 MG) BY MOUTH DAILY 12/26/19   [provider]  diclofenac (VOLTAREN) 75 MG EC tablet diclofenac sodium 75 mg tablet,delayed release    [provider]  DULoxetine (CYMBALTA) 30 MG capsule TAKE 1 CAPSULE(30 MG) BY MOUTH DAILY 03/16/18   [provider]  lisinopril-hydrochlorothiazide (PRINZIDE,ZESTORETIC) 20-12.5 MG tablet Take by mouth. 06/24/16   [provider]  traMADol (ULTRAM) 50 MG tablet TAKE 1 TABLET(50 MG) BY MOUTH EVERY 6 HOURS AS NEEDED 08/09/20   Hilts, Casimiro Needle, MD  zolpidem (AMBIEN) 10 MG tablet Take 10 mg by mouth. 05/30/16   [provider]    Allergies    Levofloxacin  Review of Systems   Review of Systems  Constitutional:  Negative for activity change, appetite change and fever.  HENT:  Negative for congestion.   Respiratory:  Negative for cough, chest tightness and shortness of breath.   Gastrointestinal:  Negative for abdominal pain, nausea and vomiting.  Genitourinary:  Negative for dysuria and hematuria.  Musculoskeletal:  Negative for arthralgias and myalgias.  Skin:  Positive for wound.  Neurological:  Negative for dizziness, weakness and headaches.  Psychiatric/Behavioral:  Positive for  dysphoric mood, self-injury and suicidal ideas.    all other systems are negative except as noted in the HPI and PMH.   Physical Exam Updated Vital Signs BP (!) 153/86 (BP Location: Right Arm)   Pulse 67   Temp 97.9 F (36.6 C) (Oral)   Resp 18   Ht 5\' 3"  (1.6 m)   Wt 83.5 kg   SpO2 99%   BMI 32.59 kg/m   Physical Exam Vitals and nursing note reviewed.  Constitutional:      General: She is not in acute distress.    Appearance: She is well-developed.      Comments: Flat affect  HENT:     Head: Normocephalic and atraumatic.     Mouth/Throat:     Pharynx: No oropharyngeal exudate.  Eyes:     Conjunctiva/sclera: Conjunctivae normal.     Pupils: Pupils are equal, round, and reactive to light.  Neck:     Comments: No meningismus. Cardiovascular:     Rate and Rhythm: Normal rate and regular rhythm.     Heart sounds: Normal heart sounds. No murmur heard. Pulmonary:     Effort: Pulmonary effort is normal. No respiratory distress.     Breath sounds: Normal breath sounds.  Abdominal:     Palpations: Abdomen is soft.     Tenderness: There is no abdominal tenderness. There is no guarding or rebound.  Musculoskeletal:        General: No tenderness. Normal range of motion.     Cervical back: Normal range of motion and neck supple.     Comments: Superficial laceration to left palmar forearm  Skin:    General: Skin is warm.  Neurological:     Mental Status: She is alert and oriented to person, place, and time.     Cranial Nerves: No cranial nerve deficit.     Motor: No abnormal muscle tone.     Coordination: Coordination normal.     Comments:  5/5 strength throughout. CN 2-12 intact.Equal grip strength.   Psychiatric:        Behavior: Behavior normal.    ED Results / Procedures / Treatments   Labs (all labs ordered are listed, but only abnormal results are displayed) Labs Reviewed  ACETAMINOPHEN LEVEL - Abnormal; Notable for the following components:      Result Value   Acetaminophen (Tylenol), Serum <10 (*)    All other components within normal limits  SALICYLATE LEVEL - Abnormal; Notable for the following components:   Salicylate Lvl <7.0 (*)    All other components within normal limits  CBC WITH DIFFERENTIAL/PLATELET  COMPREHENSIVE METABOLIC PANEL  ETHANOL  RAPID URINE DRUG SCREEN, HOSP PERFORMED  URINALYSIS, ROUTINE W REFLEX MICROSCOPIC    EKG None  Radiology No results found.  Procedures Procedures   Medications  Ordered in ED Medications - No data to display  ED Course  I have reviewed the triage vital signs and the nursing notes.  Pertinent labs & imaging results that were available during my care of the patient were reviewed by me and considered in my medical decision making (see chart for details).    MDM Rules/Calculators/A&P                         Depression with cutting behavior and suicidal gesture.  Denies active suicidal ideation.  Will obtain labs, update tetanus, consult TTS  Screening labs unremarkable.  Patient has been seen by TTS and does not meet inpatient  criteria.  Patient and husband are comfortable going home.  She denies any current suicidal ideation.  She will give a list of behavioral health resources.  Urged to return to the ED if she develops thoughts of wanting to hurt herself or anyone else. Final Clinical Impression(s) / ED Diagnoses Final diagnoses:  Current severe episode of major depressive disorder without psychotic features without prior episode [facility])    Rx / DC Orders ED Discharge Orders     None        Haja Crego, Jeannett Senior, MD 10/28/20 0320

## 2021-07-25 ENCOUNTER — Ambulatory Visit (HOSPITAL_BASED_OUTPATIENT_CLINIC_OR_DEPARTMENT_OTHER): Payer: BC Managed Care – PPO | Admitting: Orthopaedic Surgery

## 2022-02-10 ENCOUNTER — Encounter: Payer: Self-pay | Admitting: Podiatry

## 2022-02-10 ENCOUNTER — Ambulatory Visit (INDEPENDENT_AMBULATORY_CARE_PROVIDER_SITE_OTHER): Payer: BC Managed Care – PPO | Admitting: Podiatry

## 2022-02-10 DIAGNOSIS — B351 Tinea unguium: Secondary | ICD-10-CM

## 2022-02-11 NOTE — Progress Notes (Signed)
Subjective:   Patient ID: [name], female   DOB: 61 y.o.   MRN: [id number]   HPI Patient presents with a left hallux nail that has split into 2 areas for the distal two thirds with yellowness and it was painful before not as much now but she is concerned about it.  Does not smoke likes to be active   Review of Systems  All other systems reviewed and are negative.       Objective:  Physical Exam Vitals and nursing note reviewed.  Constitutional:      Appearance: She is well-developed.  Pulmonary:     Effort: Pulmonary effort is normal.  Musculoskeletal:        General: Normal range of motion.  Skin:    General: Skin is warm.  Neurological:     Mental Status: She is alert.     Neurovascular status found to be intact muscle strength found to be adequate range of motion adequate with patient noted to have a split left hallux nail distal two thirds with yellowness in the area and what appears to be a traumatic like event     Assessment:  Probability that this is more related to trauma than it is to fungus and the split may be difficult to get better      Plan:  Conveyed to patient explaining to her the issues associated with this and at this point will allow the nail to grow out and it may ultimately may need to be removed depending on response

## 2022-05-05 IMAGING — MR MR HIP*L* W/O CM
4 of 5 series · 31 of 40 positions shown · non-contrast
Comparison: None.

CLINICAL DATA: Left hip pain for 2 years.  No known injury.

EXAM:
MR OF THE LEFT HIP WITHOUT CONTRAST
TECHNIQUE: Multiplanar, multisequence MR imaging was performed. No intravenous
contrast was administered.

[Series 4: T1 · coronal · 4.0mm · 0.74mm/px · 8 of 22 slices shown]
[im 1/22]
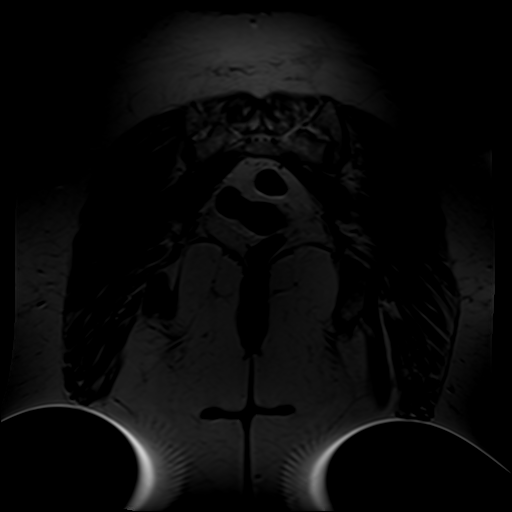
[im 4/22]
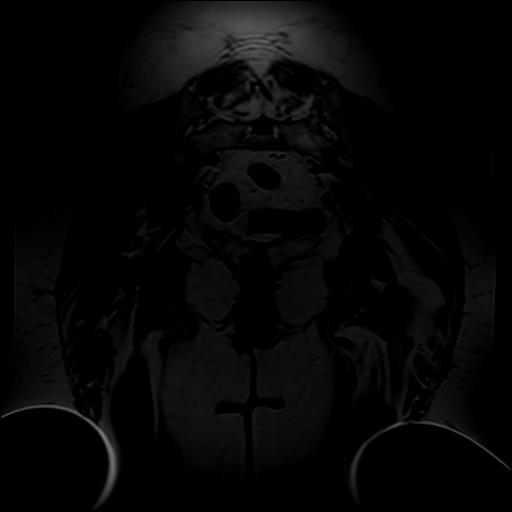
[im 7/22]
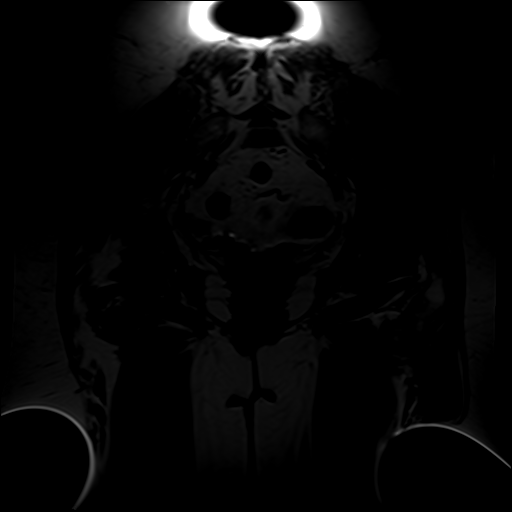
[im 10/22]
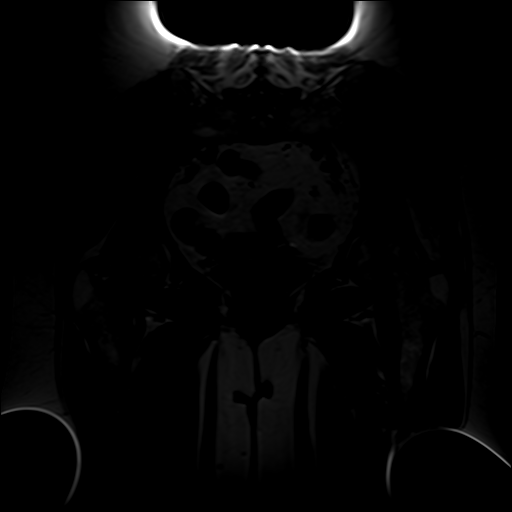
[im 13/22]
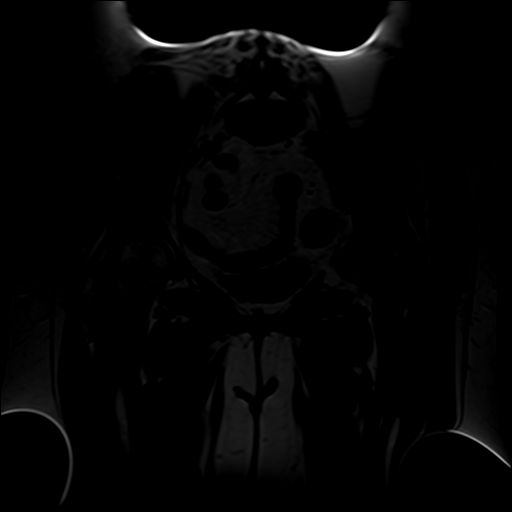
[im 16/22]
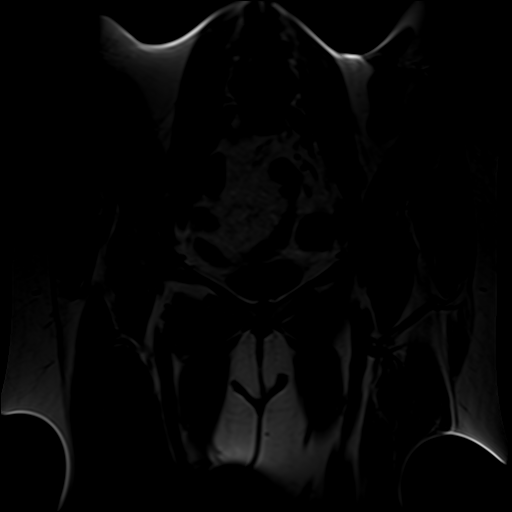
[im 19/22]
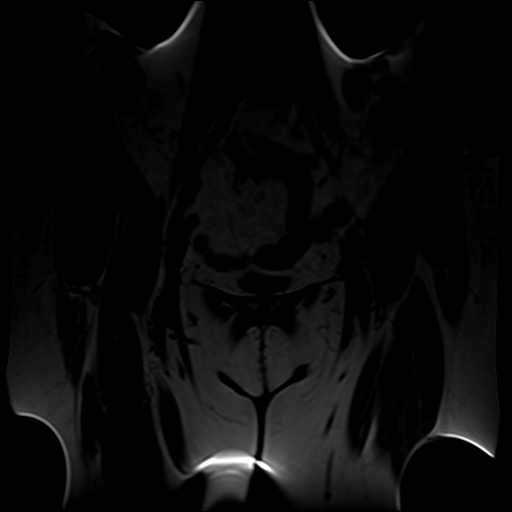
[im 22/22]
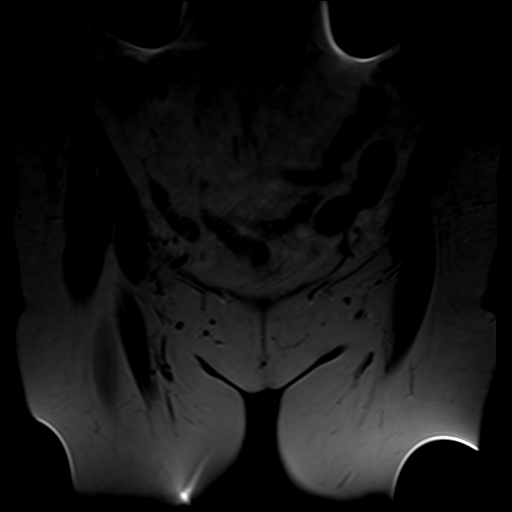

[Series 5: T2 fat-sat · coronal · 4.0mm · 0.74mm/px · 7 of 22 slices shown]
[im 1/22]
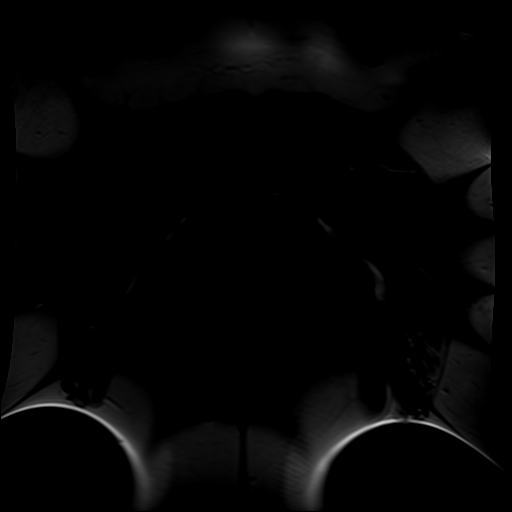
[im 4/22]
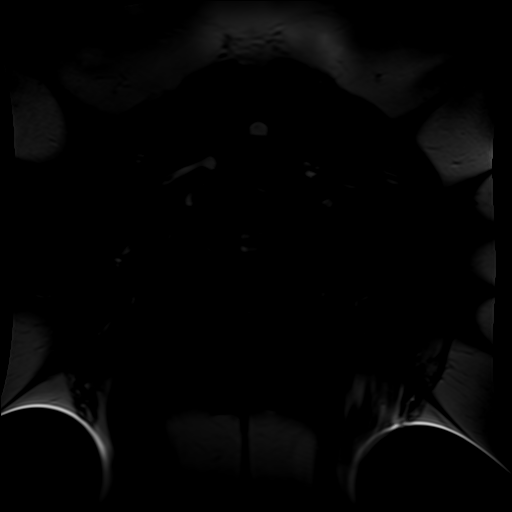
[im 8/22]
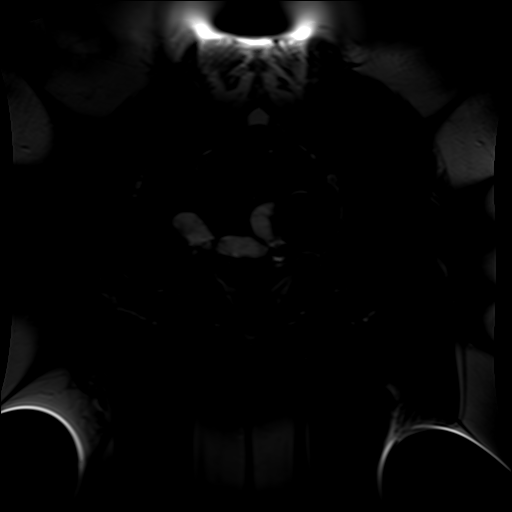
[im 11/22]
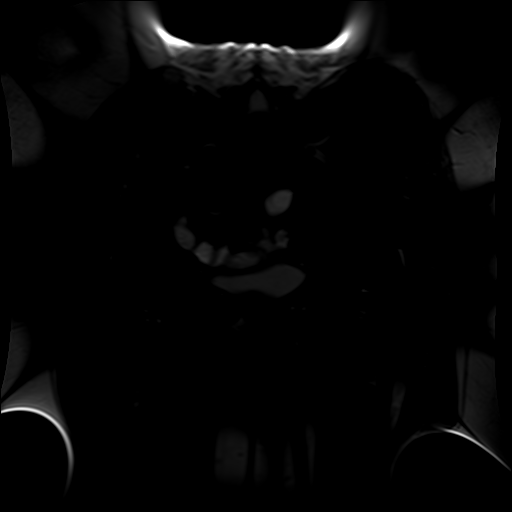
[im 15/22]
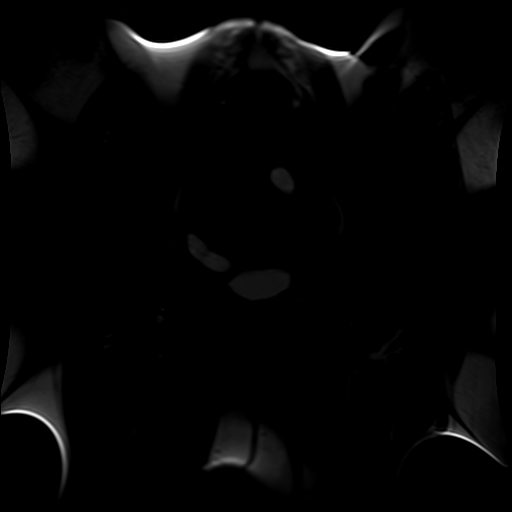
[im 18/22]
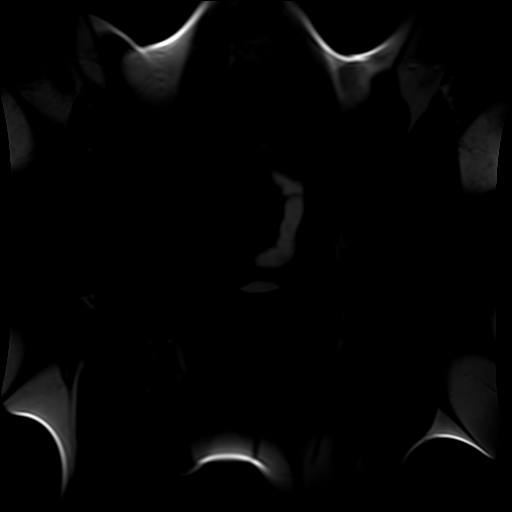
[im 22/22]
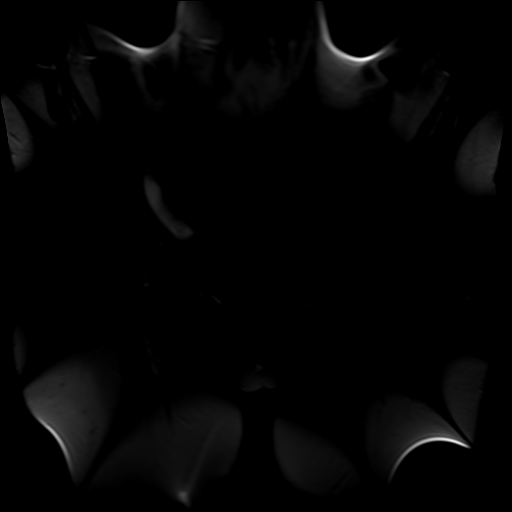

[Series 6: PD fat-sat · sagittal · 4.0mm · 0.70mm/px · 9 of 28 slices shown (1 of 2)]
[im 1/28]
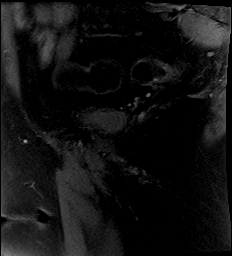
[im 4/28]
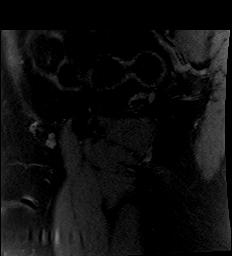
[im 7/28]
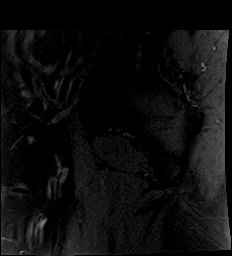
[im 11/28]
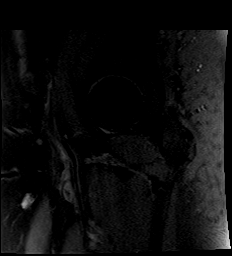
[im 14/28]
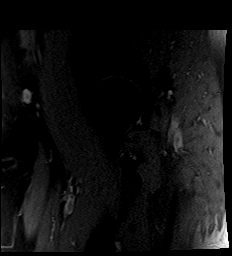
[im 17/28]
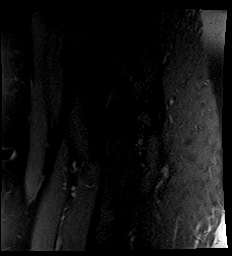
[im 21/28]
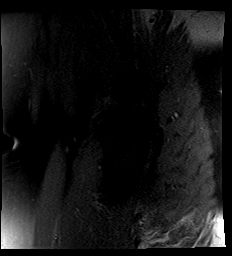
[im 24/28]
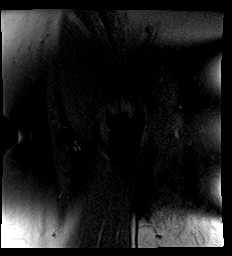
[im 28/28]
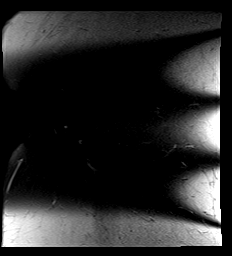

[Series 7: PD fat-sat · coronal · 4.0mm · 0.70mm/px · 7 of 25 slices shown (2 of 2)]
[im 1/25]
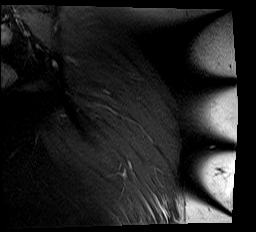
[im 4/25]
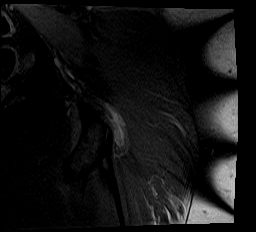
[im 7/25]
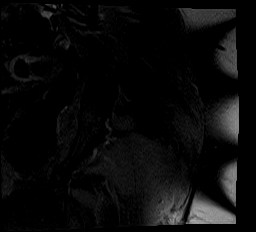
[im 11/25]
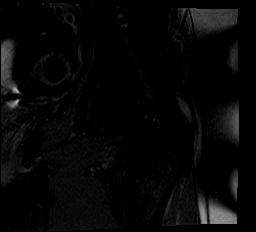
[im 14/25]
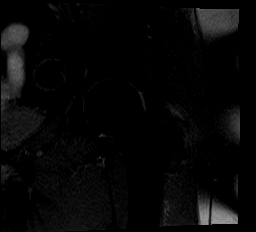
[im 18/25]
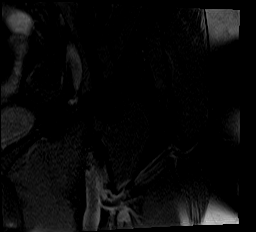
[im 21/25]
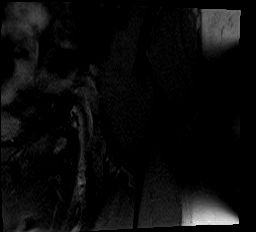

[31 of 40 positions shown; findings below may reference images not displayed]

FINDINGS: Bones: Marrow signal is normal without fracture, stress change or
worrisome lesion. No subchondral edema or cyst formation about
either hip. No avascular necrosis of the femoral heads.

Articular cartilage and labrum

Articular cartilage:  Preserved.

Labrum:  Intact.

Joint or bursal effusion

Joint effusion:  None.

Bursae: A small amount of fluid in the trochanteric bursa
bilaterally is greater on the left.

Muscles and tendons

Muscles and tendons: Intact. Edema about the left gluteus medius and
minimus tendons consistent with tendinosis noted.

Other findings

Miscellaneous:   Status post hysterectomy.  Otherwise negative.
IMPRESSION: Left worse than right trochanteric bursitis. There is secondary
gluteus medius and minimus tendinosis on the left without tear. The
left hip otherwise appears normal.

## 2023-06-23 NOTE — Progress Notes (Signed)
.  POV
